# Patient Record
Sex: Male | Born: 1955 | Race: White | Hispanic: No | Marital: Single | Smoking: Never smoker
Health system: Southern US, Community
[De-identification: ages and names within clinical notes are randomized; demographics above are authoritative.]

## PROBLEM LIST (undated history)

## (undated) DIAGNOSIS — Z87448 Personal history of other diseases of urinary system: Secondary | ICD-10-CM

## (undated) DIAGNOSIS — M109 Gout, unspecified: Secondary | ICD-10-CM

## (undated) DIAGNOSIS — Z789 Other specified health status: Secondary | ICD-10-CM

## (undated) DIAGNOSIS — Z973 Presence of spectacles and contact lenses: Secondary | ICD-10-CM

## (undated) DIAGNOSIS — M1990 Unspecified osteoarthritis, unspecified site: Secondary | ICD-10-CM

## (undated) DIAGNOSIS — Z972 Presence of dental prosthetic device (complete) (partial): Secondary | ICD-10-CM

## (undated) HISTORY — PX: COLONOSCOPY: SHX174

## (undated) HISTORY — PX: JOINT REPLACEMENT: SHX530

## (undated) HISTORY — PX: ARTHROPLASTY: SHX135

## (undated) HISTORY — PX: TONSILLECTOMY: SUR1361

---

## 1998-05-17 ENCOUNTER — Encounter: Payer: Self-pay | Admitting: Emergency Medicine

## 1998-05-17 ENCOUNTER — Emergency Department (HOSPITAL_COMMUNITY): Admission: EM | Admit: 1998-05-17 | Discharge: 1998-05-17 | Payer: Self-pay | Admitting: Emergency Medicine

## 1999-09-21 ENCOUNTER — Encounter: Payer: Self-pay | Admitting: Emergency Medicine

## 1999-09-21 ENCOUNTER — Emergency Department (HOSPITAL_COMMUNITY): Admission: EM | Admit: 1999-09-21 | Discharge: 1999-09-21 | Payer: Self-pay | Admitting: Emergency Medicine

## 2000-08-19 ENCOUNTER — Emergency Department (HOSPITAL_COMMUNITY): Admission: EM | Admit: 2000-08-19 | Discharge: 2000-08-20 | Payer: Self-pay | Admitting: Emergency Medicine

## 2012-05-08 ENCOUNTER — Inpatient Hospital Stay (HOSPITAL_COMMUNITY)
Admission: EM | Admit: 2012-05-08 | Discharge: 2012-05-11 | DRG: 278 | Disposition: A | Discharge status: Home / Self Care | Payer: BC Managed Care – PPO | Attending: Internal Medicine | Admitting: Internal Medicine

## 2012-05-08 ENCOUNTER — Encounter (HOSPITAL_COMMUNITY): Payer: Self-pay | Admitting: Emergency Medicine

## 2012-05-08 DIAGNOSIS — L02419 Cutaneous abscess of limb, unspecified: Principal | ICD-10-CM | POA: Diagnosis present

## 2012-05-08 DIAGNOSIS — N289 Disorder of kidney and ureter, unspecified: Secondary | ICD-10-CM | POA: Diagnosis present

## 2012-05-08 DIAGNOSIS — E860 Dehydration: Secondary | ICD-10-CM | POA: Diagnosis present

## 2012-05-08 DIAGNOSIS — Z792 Long term (current) use of antibiotics: Secondary | ICD-10-CM

## 2012-05-08 DIAGNOSIS — L03119 Cellulitis of unspecified part of limb: Principal | ICD-10-CM | POA: Diagnosis present

## 2012-05-08 DIAGNOSIS — L0291 Cutaneous abscess, unspecified: Secondary | ICD-10-CM

## 2012-05-08 DIAGNOSIS — L02416 Cutaneous abscess of left lower limb: Secondary | ICD-10-CM | POA: Diagnosis present

## 2012-05-08 DIAGNOSIS — L0390 Cellulitis, unspecified: Secondary | ICD-10-CM

## 2012-05-08 DIAGNOSIS — A4902 Methicillin resistant Staphylococcus aureus infection, unspecified site: Secondary | ICD-10-CM | POA: Diagnosis present

## 2012-05-08 DIAGNOSIS — M109 Gout, unspecified: Secondary | ICD-10-CM | POA: Diagnosis present

## 2012-05-08 HISTORY — DX: Other specified health status: Z78.9

## 2012-05-08 LAB — CBC WITH DIFFERENTIAL/PLATELET
Basophils Absolute: 0 10*3/uL (ref 0.0–0.1)
Basophils Relative: 0 % (ref 0–1)
Eosinophils Absolute: 0 10*3/uL (ref 0.0–0.7)
Eosinophils Relative: 0 % (ref 0–5)
HCT: 41.6 % (ref 39.0–52.0)
Hemoglobin: 14.1 g/dL (ref 13.0–17.0)
Lymphocytes Relative: 12 % (ref 12–46)
Lymphs Abs: 1.5 10*3/uL (ref 0.7–4.0)
MCH: 30.1 pg (ref 26.0–34.0)
MCHC: 33.9 g/dL (ref 30.0–36.0)
MCV: 88.9 fL (ref 78.0–100.0)
Monocytes Absolute: 1.1 10*3/uL — ABNORMAL HIGH (ref 0.1–1.0)
Monocytes Relative: 8 % (ref 3–12)
Neutro Abs: 10.3 10*3/uL — ABNORMAL HIGH (ref 1.7–7.7)
Neutrophils Relative %: 80 % — ABNORMAL HIGH (ref 43–77)
Platelets: 236 10*3/uL (ref 150–400)
RBC: 4.68 MIL/uL (ref 4.22–5.81)
RDW: 12.8 % (ref 11.5–15.5)
WBC: 13 10*3/uL — ABNORMAL HIGH (ref 4.0–10.5)

## 2012-05-08 LAB — BASIC METABOLIC PANEL
BUN: 19 mg/dL (ref 6–23)
CO2: 23 mEq/L (ref 19–32)
Calcium: 9.5 mg/dL (ref 8.4–10.5)
Chloride: 101 mEq/L (ref 96–112)
Creatinine, Ser: 1.43 mg/dL — ABNORMAL HIGH (ref 0.50–1.35)
GFR calc Af Amer: 61 mL/min — ABNORMAL LOW (ref >90)
GFR calc non Af Amer: 53 mL/min — ABNORMAL LOW (ref >90)
Glucose, Bld: 127 mg/dL — ABNORMAL HIGH (ref 70–99)
Potassium: 3.8 mEq/L (ref 3.5–5.1)
Sodium: 136 mEq/L (ref 135–145)

## 2012-05-08 MED ORDER — TRAZODONE HCL 50 MG PO TABS
50.0000 mg | ORAL_TABLET | Freq: Every evening | ORAL | Status: DC | PRN
  Filled 2012-05-08: qty 1

## 2012-05-08 MED ORDER — ENOXAPARIN SODIUM 60 MG/0.6ML ~~LOC~~ SOLN
60.0000 mg | Freq: Every day | SUBCUTANEOUS | Status: DC
  Administered 2012-05-08 – 2012-05-10 (×3): 60 mg via SUBCUTANEOUS
  Filled 2012-05-08 (×4): qty 0.6

## 2012-05-08 MED ORDER — SODIUM CHLORIDE 0.9 % IV SOLN
INTRAVENOUS | Status: DC
  Administered 2012-05-08: 23:00:00 via INTRAVENOUS

## 2012-05-08 MED ORDER — SENNOSIDES-DOCUSATE SODIUM 8.6-50 MG PO TABS
1.0000 | ORAL_TABLET | Freq: Every evening | ORAL | Status: DC | PRN
  Filled 2012-05-08: qty 1

## 2012-05-08 MED ORDER — ACETAMINOPHEN 650 MG RE SUPP: 650.0000 mg | Freq: Four times a day (QID) | RECTAL | Status: DC | PRN

## 2012-05-08 MED ORDER — SODIUM CHLORIDE 0.9 % IV BOLUS (SEPSIS)
1000.0000 mL | Freq: Once | INTRAVENOUS | Status: AC
  Administered 2012-05-08: 1000 mL via INTRAVENOUS

## 2012-05-08 MED ORDER — ACETAMINOPHEN 325 MG PO TABS: 650.0000 mg | ORAL_TABLET | Freq: Four times a day (QID) | ORAL | Status: DC | PRN

## 2012-05-08 MED ORDER — ONDANSETRON HCL 4 MG/2ML IJ SOLN: 4.0000 mg | Freq: Four times a day (QID) | INTRAMUSCULAR | Status: DC | PRN

## 2012-05-08 MED ORDER — VANCOMYCIN HCL 10 G IV SOLR
2500.0000 mg | Freq: Once | INTRAVENOUS | Status: AC
  Administered 2012-05-08: 2500 mg via INTRAVENOUS
  Filled 2012-05-08: qty 2500

## 2012-05-08 MED ORDER — CLINDAMYCIN PHOSPHATE 900 MG/50ML IV SOLN
900.0000 mg | Freq: Once | INTRAVENOUS | Status: AC
  Administered 2012-05-08: 900 mg via INTRAVENOUS
  Filled 2012-05-08: qty 50

## 2012-05-08 MED ORDER — ONDANSETRON HCL 4 MG PO TABS: 4.0000 mg | ORAL_TABLET | Freq: Four times a day (QID) | ORAL | Status: DC | PRN

## 2012-05-08 MED ORDER — OXYCODONE HCL 5 MG PO TABS
5.0000 mg | ORAL_TABLET | ORAL | Status: DC | PRN
  Administered 2012-05-09: 5 mg via ORAL
  Filled 2012-05-08: qty 1

## 2012-05-08 MED ORDER — SODIUM CHLORIDE 0.9 % IV SOLN
1750.0000 mg | INTRAVENOUS | Status: DC
  Administered 2012-05-09 – 2012-05-10 (×2): 1750 mg via INTRAVENOUS
  Filled 2012-05-08 (×3): qty 1750

## 2012-05-08 NOTE — ED Provider Notes (Signed)
History     CSN: [id number]  Arrival date & time 05/08/12  1826   First MD Initiated Contact with Patient 05/08/12 1910      Chief Complaint  Patient presents with  . Abscess    left knee  . Knee Pain    (Consider location/radiation/quality/duration/timing/severity/associated sxs/prior treatment) Patient is a 57 y.o. male presenting with abscess and knee pain. The history is provided by the patient.  Abscess  This is a new problem. The current episode started less than one week ago. The onset was gradual. Pertinent negatives include no fever. Associated symptoms comments: Draining, painful lesion to anterior left knee for several days, now with increasing pain and redness in surrounding area. He was seen by his PCP 2 days ago and started on Cipro but felt the symptoms were worse and needed re-evaluation. No fever. .  Knee Pain Pertinent negatives include no abdominal pain, chest pain, fever, headaches, myalgias or nausea.    History reviewed. No pertinent past medical history.  Past Surgical History  Procedure Date  . Arthroplasty     left knee 25 years ago    No family history on file.  History  Substance Use Topics  . Smoking status: Never Smoker   . Smokeless tobacco: Never Used  . Alcohol Use: No      Review of Systems  Constitutional: Negative for fever.  Respiratory: Negative for shortness of breath.   Cardiovascular: Negative for chest pain.  Gastrointestinal: Negative for nausea and abdominal pain.  Genitourinary: Negative for dysuria.  Musculoskeletal: Negative for myalgias.  Skin: Positive for wound.  Neurological: Negative for headaches.  Psychiatric/Behavioral: Negative for confusion.    Allergies  Review of patient's allergies indicates no known allergies.  Home Medications   Current Outpatient Rx  Name  Route  Sig  Dispense  Refill  . CIPROFLOXACIN HCL 500 MG PO TABS   Oral   Take 500 mg by mouth 2 (two) times daily. 10 day supply started  05/06/12           BP 130/73  Pulse 116  Temp 98.3 F (36.8 C) (Oral)  Resp 20  SpO2 97%  Physical Exam  Constitutional: He is oriented to person, place, and time. He appears well-developed and well-nourished. No distress.  Pulmonary/Chest: Effort normal.  Abdominal: Soft. There is no tenderness.  Musculoskeletal: He exhibits no edema.       Left knee has large, draining abscess to anterior surface in prepatellar area with erythema covering entire anterior surface. No effusion. Joint with full range of motion.   Neurological: He is alert and oriented to person, place, and time.  Skin: Skin is warm and dry. There is erythema.  Psychiatric: He has a normal mood and affect.    ED Course  Procedures (including critical care time)   Labs Reviewed  CBC WITH DIFFERENTIAL  BASIC METABOLIC PANEL  CULTURE, ROUTINE-ABSCESS   No results found.   No diagnosis found.    MDM  INCISION AND DRAINAGE Performed by: Elpidio Anis A Consent: Verbal consent obtained. Risks and benefits: risks, benefits and alternatives were discussed Type: abscess  Body area: left knee  Anesthesia: local infiltration  Incision was made with a 11-blade scalpel.  Local anesthetic: lidocaine 1% w/ epinephrine  Anesthetic total: 2 ml  Complexity: complex Blunt dissection to break up loculations  Drainage: purulent  Drainage amount: large  Packing material: 1/4 in iodoform gauze  Patient tolerance: Patient tolerated the procedure well with no immediate complications.  Plan: patient care transferred to Ivonne Andrew, PA-C with labs pending. Patient seen by Dr. Freida Busman and felt admission was appropriate given extent of infection. Patient declines need for pain medication for now.         Arnoldo Hooker, PA-C 05/08/12 1953

## 2012-05-08 NOTE — ED Provider Notes (Signed)
[name] S 8:00 PM patient discussed in sign out with Langley Adie PA-C.  Patient with extensive abscess and cellulitis around left knee. Infection is concerning and plan to have admission for continued IV antibiotics. Lab tests pending.   8:40PM Spoke with Dr. Lendell Caprice with [facility] she will see patient and admit.      Angus Seller, [place] 05/08/12 2043

## 2012-05-08 NOTE — ED Provider Notes (Signed)
Medical screening examination/treatment/procedure(s) were performed by non-physician practitioner and as supervising physician I was immediately available for consultation/collaboration.  Doug Sou, MD 05/08/12 [phone]

## 2012-05-08 NOTE — Progress Notes (Signed)
ANTIBIOTIC CONSULT NOTE - INITIAL  Pharmacy Consult for Vancomycin Indication: Cellulitis with Abscess of Left Knee  No Known Allergies  Patient Measurements: Height: 6' (182.9 cm) Weight: 275 lb (124.739 kg) IBW/kg (Calculated) : 77.6    Vital Signs: Temp: 98.6 F (37 C) (01/05 2240) Temp src: Oral (01/05 2240) BP: 122/79 mmHg (01/05 2240) Pulse Rate: 93  (01/05 2240) Intake/Output from previous day:   Intake/Output from this shift:    Labs:  Basename 05/08/12 1945  WBC 13.0*  HGB 14.1  PLT 236  LABCREA --  CREATININE 1.43*   Estimated Creatinine Clearance: 77.7 ml/min (by C-G formula based on Cr of 1.43). No results found for this basename: VANCOTROUGH:2,VANCOPEAK:2,VANCORANDOM:2,GENTTROUGH:2,GENTPEAK:2,GENTRANDOM:2,TOBRATROUGH:2,TOBRAPEAK:2,TOBRARND:2,AMIKACINPEAK:2,AMIKACINTROU:2,AMIKACIN:2, in the last 72 hours   Microbiology: No results found for this or any previous visit (from the past 720 hour(s)).  Medical History: History reviewed. No pertinent past medical history.  Medications:  Scheduled:    . [COMPLETED] clindamycin (CLEOCIN) IV  900 mg Intravenous Once  . enoxaparin (LOVENOX) injection  60 mg Subcutaneous QHS  . [COMPLETED] sodium chloride  1,000 mL Intravenous Once   Infusions:    . sodium chloride     Assessment:  57 yr old male treated with Cipro as outpatient for knee infection with several boils in the knee area.  Erythema and swelling extending up to the groin and down to the ankle noted.  In ED, one boil incised and sent for culture  Received Clindamycin 900mg  IV x 1 in ED @ 20:20  Admitted for IV Antibiotics.  IV Vancomycin per pharmacy ordered cellulitis and abscess of left knee  CrCl (n) = 58 ml/min  Goal of Therapy:  Vancomycin trough level 10-15 mcg/ml  Plan:  Measure antibiotic drug levels at steady state Follow up culture results Vancomycin 2500mg  IV x 1 load followed by 1750 mg IV q24h  Travia Onstad, Joselyn Glassman,  PharmD 05/08/2012,10:53 PM

## 2012-05-08 NOTE — H&P (Signed)
Hospital Admission Note Date: 05/08/2012  Patient name: Devin Mcdonald Medical record number: [id number] Date of birth: [birth year] Age: 57 y.o. Gender: male PCP: None, but goes to [place] walk in clinic as needed  Chief Complaint:   History of Present Illness:  Devin Mcdonald is an 57 y.o. male with no chronic medical problems who presents with a knee infection. He has no primary care provider, but frequently goes to the [facility] walk in clinic as needed. Last week, he noticed several boils in the knee area. They were send, he had erythema and swelling extending up to the groin and down to the ankle. He was seen at the walk-in clinic and given a prescription for Cipro. He was told to come to the emergency room should the infection worsened over the weekend. One of the boil started draining so he came to the emergency room. The largest area of fluctuance, just over the patella was incised by the ED staff, and drainage sent for culture. Patient has cellulitis in the knee area and therefore the hospitalists were called to admit for IV antibiotics and observation. Patient had fevers and chills a few nights ago but none since. He has difficulty flexing his knee due to swelling. He is able to bear weight.  History reviewed. No pertinent past medical history.  Meds: Cipro  Allergies: Review of patient's allergies indicates no known allergies.  Social history: Patient is single. He is a [employer]. He does not smoke drink or use drugs  Family history: Mother is deceased and had problems with substance abuse  Past Surgical History  Procedure Date  . Arthroplasty     left knee 25 years ago   Review of Systems: Systems reviewed and as per HPI, otherwise negative.  Physical Exam: Blood pressure 130/73, pulse 116, temperature 98.3 F (36.8 C), temperature source Oral, resp. rate 20, SpO2 97.00%. BP 130/73  Pulse 116  Temp 98.3 F (36.8 C) (Oral)  Resp 20  SpO2 97%  General Appearance:     Alert, cooperative, no distress, appears stated age.nontoxic   Head:    Normocephalic, without obvious abnormality, atraumatic  Eyes:    PERRL, conjunctiva/corneas clear, EOM's intact, fundi    benign, both eyes  Ears:    Normal TM's and external ear canals, both ears  Nose:   Nares normal, septum midline, mucosa normal, no drainage    or sinus tenderness  Throat:   Lips, mucosa, and tongue normal; teeth and gums normal  Neck:   Supple, symmetrical, trachea midline, no adenopathy;    thyroid:  no enlargement/tenderness/nodules; no carotid   bruit or JVD  Back:     Symmetric, no curvature, ROM normal, no CVA tenderness  Lungs:     Clear to auscultation bilaterally, respirations unlabored  Chest Wall:    No tenderness or deformity   Heart:    Regular rate and rhythm, S1 and S2 normal, no murmur, rub   or gallop     Abdomen:     Soft, non-tender, bowel sounds active all four quadrants,    no masses, no organomegaly  Genitalia:   deferred   Rectal:   deferred  Extremities:  left knee with intense erythema tenderness and warmth with purulent drainage and packing present near the patella. Decreased range of motion due to pain. Mild patchy erythema more proximally.several small purulent papules surrounding the area. Also a erythematous papule present on the left hand without drainage. No significant cellulitis of the hand  Pulses:   2+ and symmetric all extremities  Skin:   see above   Lymph nodes:   Cervical, supraclavicular, and axillary nodes normal  Neurologic:   CNII-XII intact, normal strength, sensation and reflexes    throughout    Psychiatric: Normal affect.  Lab results: Basic Metabolic Panel:  Basename 05/08/12 1945  NA 136  K 3.8  CL 101  CO2 23  GLUCOSE 127*  BUN 19  CREATININE 1.43*  CALCIUM 9.5  MG --  PHOS --   Liver Function Tests: No results found for this basename: AST:2,ALT:2,ALKPHOS:2,BILITOT:2,PROT:2,ALBUMIN:2 in the last 72 hours No results found for this  basename: LIPASE:2,AMYLASE:2 in the last 72 hours No results found for this basename: AMMONIA:2 in the last 72 hours CBC:  Basename 05/08/12 1945  WBC 13.0*  NEUTROABS 10.3*  HGB 14.1  HCT 41.6  MCV 88.9  PLT 236   Assessment & Plan: Principal Problem:  *Abscess of left knee, post I&D. Cultures are pending. Active Problems:  Renal insufficiency  Will place on observation. Give IV vancomycin. Hydrate. Repeat basic metabolic panel in the morning. DVT prophylaxis. Pain medications as needed.  Eola Waldrep L 05/08/2012, 9:15 PM

## 2012-05-08 NOTE — ED Provider Notes (Signed)
Medical screening examination/treatment/procedure(s) were performed by non-physician practitioner and as supervising physician I was immediately available for consultation/collaboration.  Adiva Boettner, MD 05/08/12 2323 

## 2012-05-08 NOTE — ED Notes (Addendum)
Pt presents w/ red, painful, swollen left knee. Pt noted 2 small pimples on knee 12/26. Pt seen at [facility] last evening and started on oral antibx and instructed to come to ED if pain worsens. Knee is now draining large amts of reddish pink liquid, although pt does indicate the swelling is less today. Pt has pimple on back of left hand

## 2012-05-09 ENCOUNTER — Encounter (HOSPITAL_COMMUNITY): Payer: Self-pay | Admitting: *Deleted

## 2012-05-09 DIAGNOSIS — L0291 Cutaneous abscess, unspecified: Secondary | ICD-10-CM

## 2012-05-09 DIAGNOSIS — M109 Gout, unspecified: Secondary | ICD-10-CM | POA: Diagnosis present

## 2012-05-09 DIAGNOSIS — E860 Dehydration: Secondary | ICD-10-CM

## 2012-05-09 LAB — HIV ANTIBODY (ROUTINE TESTING W REFLEX): HIV: NONREACTIVE

## 2012-05-09 LAB — BASIC METABOLIC PANEL
Chloride: 104 mEq/L (ref 96–112)
Creatinine, Ser: 1.26 mg/dL (ref 0.50–1.35)
GFR calc Af Amer: 72 mL/min — ABNORMAL LOW (ref >90)
Potassium: 3.9 mEq/L (ref 3.5–5.1)

## 2012-05-09 LAB — CBC
HCT: 37.3 % — ABNORMAL LOW (ref 39.0–52.0)
Hemoglobin: 12.8 g/dL — ABNORMAL LOW (ref 13.0–17.0)
MCV: 88.2 fL (ref 78.0–100.0)
RBC: 4.23 MIL/uL (ref 4.22–5.81)
WBC: 10 10*3/uL (ref 4.0–10.5)

## 2012-05-09 MED ORDER — PIPERACILLIN-TAZOBACTAM 3.375 G IVPB
3.3750 g | Freq: Three times a day (TID) | INTRAVENOUS | Status: DC
  Administered 2012-05-09 – 2012-05-10 (×4): 3.375 g via INTRAVENOUS
  Filled 2012-05-09 (×4): qty 50

## 2012-05-09 MED ORDER — PREDNISONE 20 MG PO TABS
40.0000 mg | ORAL_TABLET | Freq: Every day | ORAL | Status: DC
  Administered 2012-05-09 – 2012-05-11 (×3): 40 mg via ORAL
  Filled 2012-05-09 (×4): qty 2

## 2012-05-09 MED ORDER — IBUPROFEN 600 MG PO TABS
600.0000 mg | ORAL_TABLET | Freq: Three times a day (TID) | ORAL | Status: DC
  Administered 2012-05-09 – 2012-05-11 (×6): 600 mg via ORAL
  Filled 2012-05-09 (×9): qty 1

## 2012-05-09 MED ORDER — PANTOPRAZOLE SODIUM 40 MG PO TBEC
40.0000 mg | DELAYED_RELEASE_TABLET | Freq: Every day | ORAL | Status: DC
  Administered 2012-05-09 – 2012-05-11 (×3): 40 mg via ORAL
  Filled 2012-05-09 (×4): qty 1

## 2012-05-09 NOTE — Progress Notes (Signed)
ANTIBIOTIC CONSULT NOTE - INITIAL  Pharmacy Consult for Zosyn Indication: LLE cellulitis/abcess  No Known Allergies  Patient Measurements: Height: 6' (182.9 cm) Weight: 275 lb (124.739 kg) IBW/kg (Calculated) : 77.6   Vital Signs: Temp: 97.9 F (36.6 C) (01/06 0626) Temp src: Oral (01/06 0626) BP: 106/69 mmHg (01/06 0626) Pulse Rate: 85  (01/06 0626) Intake/Output from previous day: 01/05 0701 - 01/06 0700 In: 875 [I.V.:375; IV Piggyback:500] Out: 600 [Urine:600] Intake/Output from this shift: Total I/O In: -  Out: 450 [Urine:450]  Labs:  [facility] 05/09/12 0430 05/08/12 1945  WBC -- 13.0*  HGB -- 14.1  PLT -- 236  LABCREA -- --  CREATININE 1.26 1.43*   Estimated Creatinine Clearance: 88.2 ml/min (by C-G formula based on Cr of 1.26). No results found for this basename: VANCOTROUGH:2,VANCOPEAK:2,VANCORANDOM:2,GENTTROUGH:2,GENTPEAK:2,GENTRANDOM:2,TOBRATROUGH:2,TOBRAPEAK:2,TOBRARND:2,AMIKACINPEAK:2,AMIKACINTROU:2,AMIKACIN:2, in the last 72 hours    Assessment: 57 yo obese male presented 1/5 with c/o red,painful, swollen L knee. Pt seen at [facility] primary care 1/3 and prescribed Cipro with order to come to ED if pain worsens. Abscess now present. I&D performed in the ED.  Vancomycin started 1/5, MD now adding Zosyn for gram negative coverage.  Patient is afebrile, WBC is 13K from 1/5, Scr improving with CrCl of 88 ml/min, normalized 65 ml/min.  Abscess culture collected and currently pending.    Goal of Therapy:  Vancomycin trough level 15-20 mcg/ml Appropriate renal dosing of Zosyn  Plan:   Zosyn 3.375 gm IV q8h  Continue Vancomycin 1750 mg IV q24h  Pharmacy will f/u  Geoffry Paradise, PharmD, BCPS Pager: [phone] 9:43 AM Pharmacy #: [phone]

## 2012-05-09 NOTE — Care Management Note (Addendum)
    Page 1 of 1   05/11/2012     12:19:34 PM   CARE MANAGEMENT NOTE 05/11/2012  Patient:  Devin Mcdonald, Devin Mcdonald   Account Number:  [id number]  Date Initiated:  05/09/2012  Documentation initiated by:  Lorenda Ishihara  Subjective/Objective Assessment:   57 yo male admitted with knee abscess. PTA lived at home alone.     Action/Plan:   Home when stable   Anticipated DC Date:  05/11/2012   Anticipated DC Plan:  HOME/SELF CARE      DC Planning Services  CM consult  Other      Choice offered to / List presented to:             Status of service:  Completed, signed off Medicare Important Message given?   (If response is "NO", the following Medicare IM given date fields will be blank) Date Medicare IM given:   Date Additional Medicare IM given:    Discharge Disposition:  HOME/SELF CARE  Per UR Regulation:  Reviewed for med. necessity/level of care/duration of stay  If discussed at Long Length of Stay Meetings, dates discussed:    Comments:  05-11-12 Lorenda Ishihara RN CM 1200 Spoke with patient at bedside. Will need f/u with wound care. Have arrange for an appt with [facility] wound care ctr for 8 am 05-12-12. Benefits check for Zyvox will require patient to pay $75 out of pocket. Patient to discuss with physician to assess if there is an alternative. Patient has contacted [name] and has PCP Devin Mcdonald). Plans to f/u with him at d/c.

## 2012-05-09 NOTE — Progress Notes (Signed)
[facility] PROGRESS NOTE  Devin Mcdonald ZOX:[id number] DOB: [birth year] DOA: 05/08/2012 PCP: No primary provider on file.  Assessment/Plan: Principal Problem:  *Abscess of left knee Active Problems:  Renal insufficiency    1. Cellulitis LLE: Patient presented with progressive redness, swelling and pain of LLE, complicating a couple of boils over the left knee. Managing with iv Vancomycin, now day# 2. Abscess and blood cultures are pending. He has remained afebrile overnight, and local inflammatory phenomena have already improved. Will add Zosyn.  2. Abscess of left knee: This was the culprit for #1 above. Patient is s/p I&D done by ED MD. Abscess cultures are pending. Consult [facility] for wound care. Do HIV test.  3. Dehydration/AKI. Patient had creatinine of 1.43 at presentation, BUN 19. This is likely secondary to mild dehydration. Managed with iv fluids, and creatinine is 1.26 today. Have discontinue iv fluids today.  4. Gout: Patient has known history of gout, with periodic flares. Now complaining of pain in the forefoot, reminiscent of similar previous flares. Will manage with NSAIDS and short course of steroids.    Code Status: Full Code.  Family Communication:  Disposition Plan: To be determined.    Brief narrative: 57 y.o. 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 He has no primary care provider, but goes to the [place] walk-in clinic as needed. Last week, he noticed several boils in the left knee area, followed by erythema and swelling extending up to the groin and down to the ankle. He was seen at the walk-in clinic and given a prescription for Cipro. He was told to come to the emergency room should the infection worsened over the weekend. One of the boils started draining, so he came to the emergency room. The largest area of fluctuance, just over the patella was incised by the ED  staff, and drainage sent for culture. Patient had fevers and chills a few nights ago but none since. He has difficulty flexing his knee due to swelling. He is able to bear weight. He was admitted for further management.    Consultants:  N/A  Procedures:  N/A  Antibiotics:  Vancomycin 05/08/12>>>  Clindamycin 05/08/12>>>  HPI/Subjective: Feels better. Complains of pain left forefoot.   Objective: Vital signs in last 24 hours: Temp:  [97.9 F (36.6 C)-98.6 F (37 C)] 97.9 F (36.6 C) (01/06 0626) Pulse Rate:  [85-116] 85  (01/06 0626) Resp:  [18-20] 18  (01/06 0626) BP: (106-130)/(69-79) 106/69 mmHg (01/06 0626) SpO2:  [97 %-99 %] 97 % (01/06 0626) Weight:  [124.739 kg (275 lb)] 124.739 kg (275 lb) (01/05 2235) Weight change:  Last BM Date: 05/07/12  Intake/Output from previous day: 01/05 0701 - 01/06 0700 In: 875 [I.V.:375; IV Piggyback:500] Out: 600 [Urine:600]     Physical Exam: General: Comfortable, alert, communicative, fully oriented, not short of breath at rest.  HEENT:  No clinical pallor, no jaundice, no conjunctival injection or discharge. Hydration is fair.  NECK:  Supple, JVP not seen, no carotid bruits, no palpable lymphadenopathy, no palpable goiter. CHEST:  Clinically clear to auscultation, no wheezes, no crackles. HEART:  Sounds 1 and 2 heard, normal, regular, no murmurs. ABDOMEN:  Full, soft, non-tender, no palpable organomegaly, no palpable masses, normal bowel sounds. GENITALIA:  Not examined. LOWER EXTREMITIES:  RLE is unremarkable. LLE: Knee is under dressings. Erythema and  induration is noted on left thigh. Borders demarcated with marker pen. No  pitting edema, palpable peripheral pulses. MUSCULOSKELETAL SYSTEM:  Unremarkable. CENTRAL NERVOUS SYSTEM:  No focal neurologic deficit on gross examination.  Lab Results:  [facility] 05/08/12 1945  WBC 13.0*  HGB 14.1  HCT 41.6  PLT 236    Basename 05/09/12 0430 05/08/12 1945  NA 137 136  K 3.9  3.8  CL 104 101  CO2 22 23  GLUCOSE 99 127*  BUN 17 19  CREATININE 1.26 1.43*  CALCIUM 8.5 9.5   Recent Results (from the past 240 hour(s))  CULTURE, ROUTINE-ABSCESS     Status: Normal (Preliminary result)   Collection Time   05/08/12  7:51 PM      Component Value Range Status Comment   Specimen Description KNEE JOINT   Final    Special Requests NONE   Final    Gram Stain PENDING   Incomplete    Culture NO GROWTH   Final    Report Status PENDING   Incomplete      Studies/Results: No results found.  Medications: Scheduled Meds:   . enoxaparin (LOVENOX) injection  60 mg Subcutaneous QHS  . vancomycin  1,750 mg Intravenous Q24H   Continuous Infusions:   . sodium chloride 50 mL/hr at 05/08/12 2230   PRN Meds:.acetaminophen, acetaminophen, ondansetron (ZOFRAN) IV, ondansetron, oxyCODONE, senna-docusate, traZODone    LOS: 1 day   Devin Mcdonald,CHRISTOPHER  [facility] Pager [phone]. If 8PM-8AM, please contact night-coverage at [email or web address], password [facility] 05/09/2012, 8:21 AM  LOS: 1 day

## 2012-05-10 LAB — BASIC METABOLIC PANEL
BUN: 19 mg/dL (ref 6–23)
CO2: 22 mEq/L (ref 19–32)
Calcium: 8.9 mg/dL (ref 8.4–10.5)
GFR calc non Af Amer: 60 mL/min — ABNORMAL LOW (ref >90)
Glucose, Bld: 116 mg/dL — ABNORMAL HIGH (ref 70–99)

## 2012-05-10 LAB — CBC
HCT: 34.9 % — ABNORMAL LOW (ref 39.0–52.0)
Hemoglobin: 12.1 g/dL — ABNORMAL LOW (ref 13.0–17.0)
MCH: 30.3 pg (ref 26.0–34.0)
MCHC: 34.7 g/dL (ref 30.0–36.0)
MCV: 87.3 fL (ref 78.0–100.0)
RBC: 4 MIL/uL — ABNORMAL LOW (ref 4.22–5.81)

## 2012-05-10 NOTE — Consult Note (Signed)
WOC consult Note Reason for Consult:  S/P I&D of left knee lesion. Consult requested by Dr. Brien Few. Wound type:surgical vs infectious Pressure Ulcer POA: No Measurement:Blister surrounding wound measures 3cm x 6cm x .2cm.  Area in center is a purple discoloration measuring 2cm x 2cm with a .5cm round defect that has a depth of 1cm and undermining to 1.5cm circumferentially. Wound bed:Not able to visulaize inside of drained abscess Drainage (amount, consistency, odor) Thick purulent exudate expressed from wound, approximately 5ml Periwound:resolving erythema and induration Dressing procedure/placement/frequency: I recommend inserting a "wick" of silver hydrofiber (Aquacel Ag+) into wound and fan-folding the excess across the wound's exterior surfaces before topping with DSD.  The patient will require follow up and has no PCP at this time.  Suggest referral to either outpatient wound care center at [facility] or orthopedics, but will need follow-up by the end of this week.  If not able to be seen by that time, patient may require home health RN for dressing changes. I will not follow.  Please re-consult if needed. Thanks, Ladona Mow, MSN, RN, [facility], CWOCN [phone])

## 2012-05-10 NOTE — Progress Notes (Signed)
[facility] PROGRESS NOTE  Devin Mcdonald AVW:[id number] DOB: Feb 11, 1956 DOA: 05/08/2012 PCP: No primary provider on file.  Assessment/Plan: Principal Problem:  *Abscess of left knee Active Problems:  Renal insufficiency  Dehydration  Gout attack    1. Cellulitis LLE: Patient presented with progressive redness, swelling and pain of LLE, complicating a couple of boils over the left knee. Managing with iv Vancomycin day# 3/Zosyn day#2. Blood cultures are pending. Abscess culture grew Staph Aureus. Suspect MRSA. He has remained afebrile overnight, and local inflammatory phenomena have dramatically improved. Will discontinue Zosyn.  2. Abscess of left knee: This was the culprit for #1 above. Patient is s/p I&D done by ED MD. Abscess cultures are pending. Consulted WOC for wound care. HIV test is non-reactive.  3. Dehydration/AKI. Patient had creatinine of 1.43 at presentation, BUN 19. This is likely secondary to mild dehydration. Managed with iv fluids, and creatinine was 1.26 on 05/09/12. IV fluids were discontinued accordingly.  4. Gout: Patient has known history of gout, with periodic flares. On 05/09/12, complained of pain in the forefoot, reminiscent of similar previous flares. Manage with NSAIDS and 5-day course of steroids. Improved.    Code Status: Full Code.  Family Communication:  Disposition Plan: To be determined. Aiming discharge on 05/11/12.    Brief narrative: 57 y.o. 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 He has no primary care provider, but goes to the [place] walk-in clinic as needed. Last week, he noticed several boils in the left knee area, followed by erythema and swelling extending up to the groin and down to the ankle. He was seen at the walk-in clinic and given a prescription for Cipro. He was told to come to the emergency room should the infection worsened over the  weekend. One of the boils started draining, so he came to the emergency room. The largest area of fluctuance, just over the patella was incised by the ED staff, and drainage sent for culture. Patient had fevers and chills a few nights ago but none since. He has difficulty flexing his knee due to swelling. He is able to bear weight. He was admitted for further management.    Consultants:  N/A  Procedures:  N/A  Antibiotics:  Vancomycin 05/08/12>>>  Clindamycin 05/08/12>>>  HPI/Subjective: Feels better.   Objective: Vital signs in last 24 hours: Temp:  [97.8 F (36.6 C)-98.1 F (36.7 C)] 98.1 F (36.7 C) (01/06 2200) Pulse Rate:  [79-89] 79  (01/06 2200) Resp:  [18] 18  (01/06 2200) BP: (114-123)/(68-74) 123/74 mmHg (01/06 2200) SpO2:  [94 %-97 %] 97 % (01/06 2200) Weight change:  Last BM Date: 05/07/12  Intake/Output from previous day: 01/06 0701 - 01/07 0700 In: 650 [IV Piggyback:650] Out: 2325 [Urine:2325] Total I/O In: -  Out: 275 [Urine:275]   Physical Exam: General: Comfortable, alert, communicative, fully oriented, not short of breath at rest.  HEENT:  No clinical pallor, no jaundice, no conjunctival injection or discharge. Hydration is fair.  NECK:  Supple, JVP not seen, no carotid bruits, no palpable lymphadenopathy, no palpable goiter. CHEST:  Clinically clear to auscultation, no wheezes, no crackles. HEART:  Sounds 1 and 2 heard, normal, regular, no murmurs. ABDOMEN:  Full, soft, non-tender, no palpable organomegaly, no palpable masses, normal bowel sounds. GENITALIA:  Not examined. LOWER EXTREMITIES:  RLE is unremarkable. LLE: Knee is under dressings. Left thigh erythema and  induration has dramatically improved. No  pitting edema, palpable peripheral pulses. MUSCULOSKELETAL SYSTEM:  Unremarkable. CENTRAL NERVOUS SYSTEM:  No focal neurologic deficit on gross examination.  Lab Results:  Basename 05/10/12 0428 05/09/12 1050  WBC 10.7* 10.0  HGB 12.1* 12.8*   HCT 34.9* 37.3*  PLT 258 248    Basename 05/10/12 0428 05/09/12 0430  NA 137 137  K 4.2 3.9  CL 107 104  CO2 22 22  GLUCOSE 116* 99  BUN 19 17  CREATININE 1.29 1.26  CALCIUM 8.9 8.5   Recent Results (from the past 240 hour(s))  CULTURE, ROUTINE-ABSCESS     Status: Normal (Preliminary result)   Collection Time   05/08/12  7:51 PM      Component Value Range Status Comment   Specimen Description KNEE JOINT   Final    Special Requests NONE   Final    Gram Stain     Final    Value: NO WBC SEEN     NO SQUAMOUS EPITHELIAL CELLS SEEN     FEW GRAM POSITIVE COCCI     IN PAIRS   Culture     Final    Value: MODERATE STAPHYLOCOCCUS AUREUS     Note: RIFAMPIN AND GENTAMICIN SHOULD NOT BE USED AS SINGLE DRUGS FOR TREATMENT OF STAPH INFECTIONS.   Report Status PENDING   Incomplete   CULTURE, BLOOD (ROUTINE X 2)     Status: Normal (Preliminary result)   Collection Time   05/09/12 10:50 AM      Component Value Range Status Comment   Specimen Description BLOOD LEFT ARM   Final    Special Requests BOTTLES DRAWN AEROBIC AND ANAEROBIC 5CC   Final    Culture  Setup Time 05/09/2012 14:15   Final    Culture     Final    Value:        BLOOD CULTURE RECEIVED NO GROWTH TO DATE CULTURE WILL BE HELD FOR 5 DAYS BEFORE ISSUING A FINAL NEGATIVE REPORT   Report Status PENDING   Incomplete   CULTURE, BLOOD (ROUTINE X 2)     Status: Normal (Preliminary result)   Collection Time   05/09/12 10:55 AM      Component Value Range Status Comment   Specimen Description BLOOD LEFT ARM   Final    Special Requests BOTTLES DRAWN AEROBIC AND ANAEROBIC 5CC   Final    Culture  Setup Time 05/09/2012 14:18   Final    Culture     Final    Value:        BLOOD CULTURE RECEIVED NO GROWTH TO DATE CULTURE WILL BE HELD FOR 5 DAYS BEFORE ISSUING A FINAL NEGATIVE REPORT   Report Status PENDING   Incomplete      Studies/Results: No results found.  Medications: Scheduled Meds:    . enoxaparin (LOVENOX) injection  60 mg  Subcutaneous QHS  . ibuprofen  600 mg Oral TID WC  . pantoprazole  40 mg Oral Q0600  . piperacillin-tazobactam (ZOSYN)  IV  3.375 g Intravenous Q8H  . predniSONE  40 mg Oral Q breakfast  . vancomycin  1,750 mg Intravenous Q24H   Continuous Infusions:  PRN Meds:.acetaminophen, acetaminophen, ondansetron (ZOFRAN) IV, ondansetron, oxyCODONE, senna-docusate, traZODone    LOS: 2 days   Devin Mcdonald,Devin Mcdonald  [facility] Pager [phone]. If 8PM-8AM, please contact night-coverage at [email or web address], password [facility] 05/10/2012, 10:59 AM  LOS: 2 days

## 2012-05-11 LAB — CULTURE, ROUTINE-ABSCESS: Gram Stain: NONE SEEN

## 2012-05-11 LAB — BASIC METABOLIC PANEL
CO2: 23 mEq/L (ref 19–32)
Calcium: 8.8 mg/dL (ref 8.4–10.5)
Creatinine, Ser: 1.16 mg/dL (ref 0.50–1.35)
GFR calc non Af Amer: 68 mL/min — ABNORMAL LOW (ref >90)
Glucose, Bld: 96 mg/dL (ref 70–99)
Sodium: 137 mEq/L (ref 135–145)

## 2012-05-11 LAB — CBC
MCH: 29.9 pg (ref 26.0–34.0)
MCHC: 33.9 g/dL (ref 30.0–36.0)
MCV: 88.2 fL (ref 78.0–100.0)
Platelets: 271 10*3/uL (ref 150–400)

## 2012-05-11 MED ORDER — LINEZOLID 600 MG PO TABS: 600.0000 mg | ORAL_TABLET | Freq: Two times a day (BID) | ORAL | Status: DC

## 2012-05-11 MED ORDER — PREDNISONE 20 MG PO TABS: 40.0000 mg | ORAL_TABLET | Freq: Every day | ORAL | Status: DC

## 2012-05-11 NOTE — Discharge Summary (Signed)
Physician Discharge Summary  Devin Mcdonald ZOX:[id number] DOB: 05-03-1956 DOA: 05/08/2012  PCP: No primary provider on file.  Admit date: 05/08/2012 Discharge date: 05/11/2012  Recommendations for Outpatient Follow-up:  1. Pt will need to follow up with PCP in 2 weeks post discharge 2. Please obtain BMP to evaluate electrolytes and kidney function 3. Please also check CBC to evaluate Hg and Hct levels  Discharge Diagnoses:  Principal Problem:  *Abscess of left knee Active Problems:  Renal insufficiency  Dehydration  Gout attack  1. Cellulitis LLE: Patient presented with progressive redness, swelling and pain of LLE, complicating a couple of boils over the left knee. Managing with iv Vancomycin day# 3/Zosyn day#2. Blood cultures are pending. Abscess culture grew MRSA.  He has remained afebrile. -home with zyvox x 11 days to make 14 days total 2. Abscess of left knee: This was the culprit for #1 above. Patient is s/p I&D done by ED MD. Abscess cultures are pending. Consulted [facility] for wound care. HIV test is non-reactive.  -pt has follow up appointment at [facility] wound care clinic 05/12/12 3. Dehydration/AKI. Patient had creatinine of 1.43 at presentation, BUN 19. This is likely secondary to mild dehydration. Managed with iv fluids, and creatinine was 1.26 on 05/09/12. IV fluids were discontinued accordingly.  4. Gout: Patient has known history of gout, with periodic flares. On 05/09/12, complained of pain in the forefoot, reminiscent of similar previous flares.  --d/c home with prednisone 40mg  once daily x 3 more days  Discharge Condition: stable  Disposition: home  Diet:cardiac Wt Readings from Last 3 Encounters:  05/08/12 124.739 kg (275 lb)   Brief narrative:  57 y.o. 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 He has no primary care provider, but goes to the [place] walk-in clinic as needed.  Last week, he noticed several boils in the left knee area, followed by erythema and swelling extending up to the groin and down to the ankle. He was seen at the walk-in clinic and given a prescription for Cipro. He was told to come to the emergency room should the infection worsened over the weekend. One of the boils started draining, so he came to the emergency room. The largest area of fluctuance, just over the patella was incised by the ED staff, and drainage sent for culture. Patient had fevers and chills a few nights ago but none since admission. He has difficulty flexing his knee due to swelling. He is able to bear weight. The patient was placed on intravenous vancomycin after blood cultures were obtained. Blood cultures are negative at the time of discharge. The patient erythema and cellulitis on his left leg improved significantly. The patient stated that his pain, erythema, and edema improved significantly. The patient will be discharged on Zyvox 600 mg by mouth twice a day x11 days. He was given an appointment to followup at the [facility] long wound care clinic on 05/12/2012. He was seen by the wound care nurse who made recommendations and wound care was provided. The patient was given intravenous fluids with improvement of his renal function. His serum creatinine at the time of discharge was 1.16. For the patient's gout he was started on prednisone 40 mg daily with improvement of his pain. He'll be discharged home with 3 additional days of prednisone 40 mg daily. The patient was advised to minimize NSAID use due to his renal insufficiency. He was strongly advised  to followup with primary care physician regarding his chronic medical issues.    Discharge Exam: Filed Vitals:   05/11/12 0541  BP: 121/63  Pulse: 62  Temp: 97.9 F (36.6 C)  Resp: 20   Filed Vitals:   05/09/12 1400 05/09/12 2200 05/10/12 2149 05/11/12 0541  BP: 114/68 123/74 120/61 121/63  Pulse: 89 79 68 62  Temp: 97.8 F (36.6  C) 98.1 F (36.7 C) 98.6 F (37 C) 97.9 F (36.6 C)  TempSrc: Oral Oral Oral Oral  Resp: 18 18 18 20   Height:      Weight:      SpO2: 94% 97% 98% 100%   General: A&O x 3, NAD, pleasant, cooperative Cardiovascular: RRR, no rub, no gallop, no S3 Respiratory: CTAB, no wheeze, no rhonchi Abdomen:soft, nontender, nondistended, positive bowel sounds Extremities: Left lower extremity without any lymphangitis, crepitance, necrosis. Punctate areas on his left knee prepatellar area with purulent drainage without any necrosis, crepitance.  Discharge Instructions      Discharge Orders    Future Orders Please Complete By Expires   Diet - low sodium heart healthy      Increase activity slowly      Discharge instructions      Comments:   Finish 11 days of Zyvox--take every pill Go to [facility] tomorrow       Medication List     As of 05/11/2012 12:35 PM    STOP taking these medications         ciprofloxacin 500 MG tablet   Commonly known as: CIPRO      TAKE these medications         linezolid 600 MG tablet   Commonly known as: ZYVOX   Take 1 tablet (600 mg total) by mouth every 12 (twelve) hours.      predniSONE 20 MG tablet   Commonly known as: DELTASONE   Take 2 tablets (40 mg total) by mouth daily with breakfast.          The results of significant diagnostics from this hospitalization (including imaging, microbiology, ancillary and laboratory) are listed below for reference.    Significant Diagnostic Studies: No results found.   Microbiology: Recent Results (from the past 240 hour(s))  CULTURE, ROUTINE-ABSCESS     Status: Normal   Collection Time   05/08/12  7:51 PM      Component Value Range Status Comment   Specimen Description KNEE JOINT   Final    Special Requests NONE   Final    Gram Stain     Final    Value: NO WBC SEEN     NO SQUAMOUS EPITHELIAL CELLS SEEN     FEW GRAM POSITIVE COCCI     IN PAIRS   Culture     Final     Value: MODERATE METHICILLIN RESISTANT STAPHYLOCOCCUS AUREUS     Note: RIFAMPIN AND GENTAMICIN SHOULD NOT BE USED AS SINGLE DRUGS FOR TREATMENT OF STAPH INFECTIONS. This organism DOES NOT demonstrate inducible Clindamycin resistance in vitro. CRITICAL RESULT CALLED TO, READ BACK BY AND VERIFIED WITH: [facility] MEANS      05/11/12 0820 BY [email or web address]   Report Status 05/11/2012 FINAL   Final    Organism ID, Bacteria METHICILLIN RESISTANT STAPHYLOCOCCUS AUREUS   Final   CULTURE, BLOOD (ROUTINE X 2)     Status: Normal (Preliminary result)   Collection Time   05/09/12 10:50 AM      Component Value Range  Status Comment   Specimen Description BLOOD LEFT ARM   Final    Special Requests BOTTLES DRAWN AEROBIC AND ANAEROBIC 5CC   Final    Culture  Setup Time 05/09/2012 14:15   Final    Culture     Final    Value:        BLOOD CULTURE RECEIVED NO GROWTH TO DATE CULTURE WILL BE HELD FOR 5 DAYS BEFORE ISSUING A FINAL NEGATIVE REPORT   Report Status PENDING   Incomplete   CULTURE, BLOOD (ROUTINE X 2)     Status: Normal (Preliminary result)   Collection Time   05/09/12 10:55 AM      Component Value Range Status Comment   Specimen Description BLOOD LEFT ARM   Final    Special Requests BOTTLES DRAWN AEROBIC AND ANAEROBIC 5CC   Final    Culture  Setup Time 05/09/2012 14:18   Final    Culture     Final    Value:        BLOOD CULTURE RECEIVED NO GROWTH TO DATE CULTURE WILL BE HELD FOR 5 DAYS BEFORE ISSUING A FINAL NEGATIVE REPORT   Report Status PENDING   Incomplete      Labs: Basic Metabolic Panel:  Lab 05/11/12 1610 05/10/12 0428 05/09/12 0430 05/08/12 1945  NA 137 137 137 136  K 4.0 4.2 -- --  CL 106 107 104 101  CO2 23 22 22 23   GLUCOSE 96 116* 99 127*  BUN 19 19 17 19   CREATININE 1.16 1.29 1.26 1.43*  CALCIUM 8.8 8.9 8.5 9.5  MG -- -- -- --  PHOS -- -- -- --   Liver Function Tests: No results found for this basename: AST:5,ALT:5,ALKPHOS:5,BILITOT:5,PROT:5,ALBUMIN:5 in the last 168 hours No  results found for this basename: LIPASE:5,AMYLASE:5 in the last 168 hours No results found for this basename: AMMONIA:5 in the last 168 hours CBC:  Lab 05/11/12 0438 05/10/12 0428 05/09/12 1050 05/08/12 1945  WBC 10.4 10.7* 10.0 13.0*  NEUTROABS -- -- -- 10.3*  HGB 12.2* 12.1* 12.8* 14.1  HCT 36.0* 34.9* 37.3* 41.6  MCV 88.2 87.3 88.2 88.9  PLT 271 258 248 236   Cardiac Enzymes: No results found for this basename: CKTOTAL:5,CKMB:5,CKMBINDEX:5,TROPONINI:5 in the last 168 hours BNP: No components found with this basename: POCBNP:5 CBG: No results found for this basename: GLUCAP:5 in the last 168 hours  Time coordinating discharge:  Greater than 30 minutes  Signed:  Nichoals Heyde, DO [facility] Pager: [phone] 05/11/2012, 12:35 PM

## 2012-05-11 NOTE — Progress Notes (Signed)
Pt discharged home, discharged instructions reviewed with patient, prescriptions called in to pharmacy by MD patient to pick up, dressing changed prior to discharged and he is to follow up with wound clinic on Friday 05-12-2012 13:32pm Means, [employer] RN 05-11-2012

## 2012-05-12 ENCOUNTER — Encounter (HOSPITAL_BASED_OUTPATIENT_CLINIC_OR_DEPARTMENT_OTHER): Payer: BC Managed Care – PPO | Attending: Internal Medicine

## 2012-05-12 DIAGNOSIS — L02419 Cutaneous abscess of limb, unspecified: Secondary | ICD-10-CM | POA: Insufficient documentation

## 2012-05-12 DIAGNOSIS — M109 Gout, unspecified: Secondary | ICD-10-CM | POA: Insufficient documentation

## 2012-05-12 DIAGNOSIS — Z8614 Personal history of Methicillin resistant Staphylococcus aureus infection: Secondary | ICD-10-CM | POA: Insufficient documentation

## 2012-05-12 NOTE — Progress Notes (Signed)
[facility]  NAME:  EFSTATHIOS, SAWIN                  ACCOUNT NO.:  MEDICAL RECORD NO.:  [id number]      DATE OF BIRTH:  [birth year]  PHYSICIAN:  Maxwell Caul, M.D. VISIT DATE:  05/12/2012                                  OFFICE VISIT   LOCATION:  [facility].  Mr. Bloom is a 57 year old man who was discharged from the hospital yesterday after a stay from May 08, 2012 through May 11, 2012. His history is that, the day after Christmas he developed 2 boils just under his left knee.  Both of these opened, 1 of them actually closed over; however, the other one did not.  This started to drain and pain redness and swelling spread up his leg fairly rapidly.  He was admitted to hospital for this.  Cultures of these areas showed MRSA.  He is not aware of any trauma to the area that could have punctured his skin.  He was managed with IV antibiotics.  His creatinine was 1.26.  His only past history is that of gout with a mildly elevated uric acid according to the patient, although it does not sound as though he has ever been on anything prophylactically.  His gout is only ever involved his feet.  In the hospital he was given a prednisone taper for pain across his metatarsal heads, left greater than right.  He does not think this involved the knee.  There was no suggestion of a knee effusion; however, there was considerable erythema of his leg which is marked obviously by somebody in the hospital.  He was discharged on Zyvox 600 mg twice a day for a further of 11 days. He is generally feeling better.  No systemic symptoms.  No fever.  His discharge creatinine was 1.16.  PAST MEDICAL HISTORY:  Negative for prior problems with MRSA.  He is not on anything for his gout.  He basically uses p.r.n. medications for periodic flares.  CURRENT MEDICATIONS:  Zyvox 600 every 12 and prednisone 20 every 4 hours.  SOCIAL HISTORY:  The patient is a  [employer], travels to [facility] for weekly trips.  This has interrupted his working which he thinks he can manage for the next week to 2.  PHYSICAL EXAMINATION:  VITAL SIGNS:  On examination temperature is 98.2, pulse 78, respirations 18, blood pressure 147/87. WOUND EXAM:  The area in question is over his left knee, roughly over the prepatellar bursa.  There are small open areas here which are still draining.  One of these probes 5 cm laterally.  There does not appear to be any involvement of the left knee.  I could not convince myself of an effusion in left knee.  The marks up into his left thigh especially medially indicate marked improvement from what was present when he entered the hospital even 3-4 days ago.  There is no erythema other than the 50 cent piece sized area over the left knee. RESPIRATORY:  Clear air entry bilaterally. CARDIAC:  No murmurs.  IMPRESSIONS: 1. Abscess involving the prepatellar area of the left knee.  This is     not involve the joint, but is still probing a considerable distance     (see above.)  There is still purulent drainage.  The area of the     erythema up into his mid thigh that was marked in the hospital     shows marked improvement.  I have urged him to continue the Zyvox     as ordered.  He may actually need another week of this; however, I     will see him next week and make this judgement.  We have packed the     draining abscess with Iodoform gauze.  Given him some to change     this every 2nd day with an occlusive dressing.  I do not think this     is going to be more complicated than this.  Although presence of     MRSA in a bursa sometimes can be a protracted issue. 2. Gout, everything seems to have calmed down here.  He has an     elevated uric acid level.  He is probably going to need long-term     gout prophylaxis.  He tells me he has an appointment with a     physician to follow up on this.           ______________________________ Maxwell Caul, M.D.     MGR/MEDQ  D:  05/12/2012  T:  05/12/2012  Job:  [id number]

## 2012-05-15 LAB — CULTURE, BLOOD (ROUTINE X 2): Culture: NO GROWTH

## 2012-06-09 ENCOUNTER — Encounter (HOSPITAL_BASED_OUTPATIENT_CLINIC_OR_DEPARTMENT_OTHER): Payer: BC Managed Care – PPO | Attending: Internal Medicine

## 2012-06-09 DIAGNOSIS — L02419 Cutaneous abscess of limb, unspecified: Secondary | ICD-10-CM | POA: Insufficient documentation

## 2013-02-14 ENCOUNTER — Other Ambulatory Visit: Payer: Self-pay | Admitting: Orthopedic Surgery

## 2013-02-15 ENCOUNTER — Encounter (HOSPITAL_BASED_OUTPATIENT_CLINIC_OR_DEPARTMENT_OTHER): Payer: Self-pay | Admitting: *Deleted

## 2013-02-15 NOTE — Progress Notes (Signed)
To come in for bmet-on allopurinol with hx renal insuff

## 2013-02-16 ENCOUNTER — Encounter (HOSPITAL_BASED_OUTPATIENT_CLINIC_OR_DEPARTMENT_OTHER)
Admission: RE | Admit: 2013-02-16 | Discharge: 2013-02-16 | Disposition: A | Discharge status: Home / Self Care | Payer: Worker's Compensation | Source: Ambulatory Visit | Attending: Orthopedic Surgery | Admitting: Orthopedic Surgery

## 2013-02-16 DIAGNOSIS — Z01812 Encounter for preprocedural laboratory examination: Secondary | ICD-10-CM | POA: Diagnosis present

## 2013-02-16 LAB — BASIC METABOLIC PANEL
Chloride: 100 mEq/L (ref 96–112)
GFR calc Af Amer: 75 mL/min — ABNORMAL LOW (ref >90)
Potassium: 4.8 mEq/L (ref 3.5–5.1)
Sodium: 136 mEq/L (ref 135–145)

## 2013-02-20 ENCOUNTER — Encounter (HOSPITAL_BASED_OUTPATIENT_CLINIC_OR_DEPARTMENT_OTHER): Payer: Worker's Compensation | Admitting: Certified Registered Nurse Anesthetist

## 2013-02-20 ENCOUNTER — Ambulatory Visit (HOSPITAL_BASED_OUTPATIENT_CLINIC_OR_DEPARTMENT_OTHER)
Admission: RE | Admit: 2013-02-20 | Discharge: 2013-02-20 | Disposition: A | Discharge status: Home / Self Care | Payer: Worker's Compensation | Source: Ambulatory Visit | Attending: Orthopedic Surgery | Admitting: Orthopedic Surgery

## 2013-02-20 ENCOUNTER — Ambulatory Visit (HOSPITAL_BASED_OUTPATIENT_CLINIC_OR_DEPARTMENT_OTHER): Payer: Worker's Compensation | Admitting: Certified Registered Nurse Anesthetist

## 2013-02-20 ENCOUNTER — Encounter (HOSPITAL_BASED_OUTPATIENT_CLINIC_OR_DEPARTMENT_OTHER)
Admission: RE | Disposition: A | Discharge status: Home / Self Care | Payer: Self-pay | Source: Ambulatory Visit | Attending: Orthopedic Surgery

## 2013-02-20 ENCOUNTER — Encounter (HOSPITAL_BASED_OUTPATIENT_CLINIC_OR_DEPARTMENT_OTHER): Payer: Self-pay

## 2013-02-20 DIAGNOSIS — M2240 Chondromalacia patellae, unspecified knee: Secondary | ICD-10-CM | POA: Insufficient documentation

## 2013-02-20 DIAGNOSIS — Y9269 Other specified industrial and construction area as the place of occurrence of the external cause: Secondary | ICD-10-CM | POA: Insufficient documentation

## 2013-02-20 DIAGNOSIS — IMO0002 Reserved for concepts with insufficient information to code with codable children: Secondary | ICD-10-CM | POA: Insufficient documentation

## 2013-02-20 DIAGNOSIS — M109 Gout, unspecified: Secondary | ICD-10-CM | POA: Insufficient documentation

## 2013-02-20 DIAGNOSIS — M19019 Primary osteoarthritis, unspecified shoulder: Secondary | ICD-10-CM | POA: Insufficient documentation

## 2013-02-20 DIAGNOSIS — M75102 Unspecified rotator cuff tear or rupture of left shoulder, not specified as traumatic: Secondary | ICD-10-CM

## 2013-02-20 DIAGNOSIS — S43429A Sprain of unspecified rotator cuff capsule, initial encounter: Secondary | ICD-10-CM | POA: Insufficient documentation

## 2013-02-20 DIAGNOSIS — Y990 Civilian activity done for income or pay: Secondary | ICD-10-CM | POA: Insufficient documentation

## 2013-02-20 DIAGNOSIS — M2340 Loose body in knee, unspecified knee: Secondary | ICD-10-CM | POA: Insufficient documentation

## 2013-02-20 DIAGNOSIS — X58XXXA Exposure to other specified factors, initial encounter: Secondary | ICD-10-CM | POA: Insufficient documentation

## 2013-02-20 HISTORY — PX: SHOULDER ARTHROSCOPY WITH ROTATOR CUFF REPAIR: SHX5685

## 2013-02-20 HISTORY — DX: Unspecified osteoarthritis, unspecified site: M19.90

## 2013-02-20 HISTORY — DX: Presence of dental prosthetic device (complete) (partial): Z97.2

## 2013-02-20 HISTORY — DX: Personal history of other diseases of urinary system: Z87.448

## 2013-02-20 HISTORY — DX: Presence of spectacles and contact lenses: Z97.3

## 2013-02-20 HISTORY — DX: Gout, unspecified: M10.9

## 2013-02-20 LAB — POCT HEMOGLOBIN-HEMACUE: Hemoglobin: 14.8 g/dL (ref 13.0–17.0)

## 2013-02-20 SURGERY — ARTHROSCOPY, SHOULDER, WITH ROTATOR CUFF REPAIR
Anesthesia: General | Site: Shoulder | Laterality: Left | Wound class: Clean

## 2013-02-20 MED ORDER — CEFAZOLIN SODIUM-DEXTROSE 2-3 GM-% IV SOLR
INTRAVENOUS | Status: AC
  Filled 2013-02-20: qty 50

## 2013-02-20 MED ORDER — DEXAMETHASONE SODIUM PHOSPHATE 4 MG/ML IJ SOLN
INTRAMUSCULAR | Status: DC | PRN
  Administered 2013-02-20: 10 mg via INTRAVENOUS

## 2013-02-20 MED ORDER — MIDAZOLAM HCL 2 MG/2ML IJ SOLN
INTRAMUSCULAR | Status: AC
  Filled 2013-02-20: qty 2

## 2013-02-20 MED ORDER — MIDAZOLAM HCL 5 MG/5ML IJ SOLN
INTRAMUSCULAR | Status: DC | PRN
  Administered 2013-02-20: 1 mg via INTRAVENOUS

## 2013-02-20 MED ORDER — LIDOCAINE HCL (CARDIAC) 20 MG/ML IV SOLN
INTRAVENOUS | Status: DC | PRN
  Administered 2013-02-20: 30 mg via INTRAVENOUS

## 2013-02-20 MED ORDER — FENTANYL CITRATE 0.05 MG/ML IJ SOLN
INTRAMUSCULAR | Status: DC | PRN
  Administered 2013-02-20: 25 ug via INTRAVENOUS
  Administered 2013-02-20: 50 ug via INTRAVENOUS
  Administered 2013-02-20: 25 ug via INTRAVENOUS

## 2013-02-20 MED ORDER — CEFAZOLIN SODIUM-DEXTROSE 2-3 GM-% IV SOLR
2.0000 g | INTRAVENOUS | Status: AC
  Administered 2013-02-20: 2 g via INTRAVENOUS

## 2013-02-20 MED ORDER — SODIUM CHLORIDE 0.9 % IR SOLN
Status: DC | PRN
  Administered 2013-02-20: 6000 mL

## 2013-02-20 MED ORDER — MIDAZOLAM HCL 2 MG/2ML IJ SOLN
1.0000 mg | INTRAMUSCULAR | Status: DC | PRN
  Administered 2013-02-20: 2 mg via INTRAVENOUS

## 2013-02-20 MED ORDER — ONDANSETRON HCL 4 MG/2ML IJ SOLN
INTRAMUSCULAR | Status: DC | PRN
  Administered 2013-02-20: 4 mg via INTRAMUSCULAR

## 2013-02-20 MED ORDER — POVIDONE-IODINE 7.5 % EX SOLN: Freq: Once | CUTANEOUS | Status: DC

## 2013-02-20 MED ORDER — KETOROLAC TROMETHAMINE 30 MG/ML IJ SOLN: 15.0000 mg | Freq: Once | INTRAMUSCULAR | Status: DC | PRN

## 2013-02-20 MED ORDER — FENTANYL CITRATE 0.05 MG/ML IJ SOLN
INTRAMUSCULAR | Status: AC
  Filled 2013-02-20: qty 2

## 2013-02-20 MED ORDER — PROPOFOL 10 MG/ML IV BOLUS
INTRAVENOUS | Status: DC | PRN
  Administered 2013-02-20: 200 mg via INTRAVENOUS

## 2013-02-20 MED ORDER — LACTATED RINGERS IV SOLN
INTRAVENOUS | Status: DC
  Administered 2013-02-20 (×2): via INTRAVENOUS

## 2013-02-20 MED ORDER — PROPOFOL 10 MG/ML IV BOLUS
INTRAVENOUS | Status: AC
  Filled 2013-02-20: qty 20

## 2013-02-20 MED ORDER — FENTANYL CITRATE 0.05 MG/ML IJ SOLN
50.0000 ug | Freq: Once | INTRAMUSCULAR | Status: AC
  Administered 2013-02-20: 100 ug via INTRAVENOUS

## 2013-02-20 MED ORDER — HYDROMORPHONE HCL PF 1 MG/ML IJ SOLN: 0.2500 mg | INTRAMUSCULAR | Status: DC | PRN

## 2013-02-20 MED ORDER — ONDANSETRON HCL 4 MG/2ML IJ SOLN: 4.0000 mg | Freq: Once | INTRAMUSCULAR | Status: DC | PRN

## 2013-02-20 MED ORDER — SUCCINYLCHOLINE CHLORIDE 20 MG/ML IJ SOLN
INTRAMUSCULAR | Status: DC | PRN
  Administered 2013-02-20: 100 mg via INTRAVENOUS

## 2013-02-20 MED ORDER — OXYCODONE-ACETAMINOPHEN 5-325 MG PO TABS: 1.0000 | ORAL_TABLET | ORAL | Status: DC | PRN

## 2013-02-20 MED ORDER — FENTANYL CITRATE 0.05 MG/ML IJ SOLN
INTRAMUSCULAR | Status: AC
  Filled 2013-02-20: qty 6

## 2013-02-20 MED ORDER — DOCUSATE SODIUM 100 MG PO CAPS: 100.0000 mg | ORAL_CAPSULE | Freq: Three times a day (TID) | ORAL | Status: DC | PRN

## 2013-02-20 SURGICAL SUPPLY — 77 items
ANCHOR PEEK 4.75X19.1 SWLK C (Anchor) ×2 IMPLANT
BENZOIN TINCTURE PRP APPL 2/3 (GAUZE/BANDAGES/DRESSINGS) IMPLANT
BLADE SURG 15 STRL LF DISP TIS (BLADE) IMPLANT
BLADE SURG 15 STRL SS (BLADE)
BLADE SURG ROTATE 9660 (MISCELLANEOUS) IMPLANT
BUR OVAL 4.0 (BURR) ×2 IMPLANT
CANISTER OMNI JUG 16 LITER (MISCELLANEOUS) ×2 IMPLANT
CANISTER SUCT 3000ML (MISCELLANEOUS) IMPLANT
CANNULA 5.75X71 LONG (CANNULA) ×2 IMPLANT
CANNULA TWIST IN 8.25X7CM (CANNULA) ×2 IMPLANT
CHLORAPREP W/TINT 26ML (MISCELLANEOUS) ×2 IMPLANT
DECANTER SPIKE VIAL GLASS SM (MISCELLANEOUS) IMPLANT
DERMABOND ADVANCED (GAUZE/BANDAGES/DRESSINGS)
DERMABOND ADVANCED .7 DNX12 (GAUZE/BANDAGES/DRESSINGS) IMPLANT
DRAPE INCISE IOBAN 66X45 STRL (DRAPES) ×2 IMPLANT
DRAPE STERI 35X30 U-POUCH (DRAPES) ×2 IMPLANT
DRAPE SURG 17X23 STRL (DRAPES) ×2 IMPLANT
DRAPE U 20/CS (DRAPES) ×2 IMPLANT
DRAPE U-SHAPE 47X51 STRL (DRAPES) ×2 IMPLANT
DRAPE U-SHAPE 76X120 STRL (DRAPES) ×4 IMPLANT
DRSG PAD ABDOMINAL 8X10 ST (GAUZE/BANDAGES/DRESSINGS) ×2 IMPLANT
ELECT REM PT RETURN 9FT ADLT (ELECTROSURGICAL)
ELECTRODE REM PT RTRN 9FT ADLT (ELECTROSURGICAL) IMPLANT
GAUZE SPONGE 4X4 16PLY XRAY LF (GAUZE/BANDAGES/DRESSINGS) IMPLANT
GAUZE XEROFORM 1X8 LF (GAUZE/BANDAGES/DRESSINGS) ×2 IMPLANT
GLOVE BIO SURGEON STRL SZ 6.5 (GLOVE) ×6 IMPLANT
GLOVE BIO SURGEON STRL SZ7 (GLOVE) ×2 IMPLANT
GLOVE BIO SURGEON STRL SZ7.5 (GLOVE) ×2 IMPLANT
GLOVE BIOGEL PI IND STRL 7.0 (GLOVE) ×3 IMPLANT
GLOVE BIOGEL PI IND STRL 8 (GLOVE) ×1 IMPLANT
GLOVE BIOGEL PI INDICATOR 7.0 (GLOVE) ×3
GLOVE BIOGEL PI INDICATOR 8 (GLOVE) ×1
GOWN BRE IMP PREV XXLGXLNG (GOWN DISPOSABLE) ×2 IMPLANT
GOWN PREVENTION PLUS XLARGE (GOWN DISPOSABLE) ×6 IMPLANT
IV NS IRRIG 3000ML ARTHROMATIC (IV SOLUTION) ×4 IMPLANT
LASSO CRESCENT QUICKPASS (SUTURE) ×2 IMPLANT
NDL SUT 6 .5 CRC .975X.05 MAYO (NEEDLE) IMPLANT
NEEDLE 1/2 CIR CATGUT .05X1.09 (NEEDLE) IMPLANT
NEEDLE MAYO TAPER (NEEDLE)
NEEDLE SCORPION MULTI FIRE (NEEDLE) IMPLANT
NS IRRIG 1000ML POUR BTL (IV SOLUTION) IMPLANT
PACK ARTHROSCOPY DSU (CUSTOM PROCEDURE TRAY) ×2 IMPLANT
PACK BASIN DAY SURGERY FS (CUSTOM PROCEDURE TRAY) ×2 IMPLANT
PENCIL BUTTON HOLSTER BLD 10FT (ELECTRODE) IMPLANT
RESECTOR FULL RADIUS 4.2MM (BLADE) ×2 IMPLANT
SLEEVE SCD COMPRESS KNEE MED (MISCELLANEOUS) ×2 IMPLANT
SLING ARM FOAM STRAP LRG (SOFTGOODS) IMPLANT
SLING ARM FOAM STRAP MED (SOFTGOODS) IMPLANT
SLING ARM FOAM STRAP XLG (SOFTGOODS) IMPLANT
SLING ARM IMMOBILIZER MED (SOFTGOODS) IMPLANT
SPONGE GAUZE 4X4 12PLY (GAUZE/BANDAGES/DRESSINGS) ×2 IMPLANT
SPONGE LAP 4X18 X RAY DECT (DISPOSABLE) IMPLANT
STRIP CLOSURE SKIN 1/2X4 (GAUZE/BANDAGES/DRESSINGS) IMPLANT
SUCTION FRAZIER TIP 10 FR DISP (SUCTIONS) IMPLANT
SUPPORT WRAP ARM LG (MISCELLANEOUS) IMPLANT
SUT 2 FIBERLOOP 20 STRT BLUE (SUTURE)
SUT BONE WAX W31G (SUTURE) IMPLANT
SUT ETHILON 3 0 PS 1 (SUTURE) ×2 IMPLANT
SUT FIBERWIRE #2 38 T-5 BLUE (SUTURE)
SUT MNCRL AB 3-0 PS2 18 (SUTURE) IMPLANT
SUT MNCRL AB 4-0 PS2 18 (SUTURE) IMPLANT
SUT PDS AB 0 CT 36 (SUTURE) ×2 IMPLANT
SUT PROLENE 3 0 PS 2 (SUTURE) IMPLANT
SUT VIC AB 0 CT1 27 (SUTURE)
SUT VIC AB 0 CT1 27XBRD ANBCTR (SUTURE) IMPLANT
SUT VIC AB 2-0 SH 27 (SUTURE)
SUT VIC AB 2-0 SH 27XBRD (SUTURE) IMPLANT
SUTURE 2 FIBERLOOP 20 STRT BLU (SUTURE) IMPLANT
SUTURE FIBERWR #2 38 T-5 BLUE (SUTURE) IMPLANT
SYR BULB 3OZ (MISCELLANEOUS) IMPLANT
TOWEL OR 17X24 6PK STRL BLUE (TOWEL DISPOSABLE) ×2 IMPLANT
TOWEL OR NON WOVEN STRL DISP B (DISPOSABLE) IMPLANT
TUBE CONNECTING 20X1/4 (TUBING) ×4 IMPLANT
TUBING ARTHROSCOPY IRRIG 16FT (MISCELLANEOUS) ×2 IMPLANT
WAND STAR VAC 90 (SURGICAL WAND) ×2 IMPLANT
WATER STERILE IRR 1000ML POUR (IV SOLUTION) ×2 IMPLANT
YANKAUER SUCT BULB TIP NO VENT (SUCTIONS) IMPLANT

## 2013-02-20 NOTE — Anesthesia Postprocedure Evaluation (Signed)
  Anesthesia Post-op Note  Patient: Devin Mcdonald  Procedure(s) Performed: Procedure(s): SHOULDER ARTHROSCOPY WITH ARTHROSCOPIC  ROTATOR CUFF REPAIR, SUBACROMIAL DECOMPRESSION, DEBRIDEMENT OF ARTHRITIS (Left)  Patient Location: PACU  Anesthesia Type:General and GA combined with regional for post-op pain  Level of Consciousness: awake, alert  and oriented  Airway and Oxygen Therapy: Patient Spontanous Breathing  Post-op Pain: none  Post-op Assessment: Post-op Vital signs reviewed, Patient's Cardiovascular Status Stable, Respiratory Function Stable, Patent Airway and Pain level controlled  Post-op Vital Signs: stable  Complications: No apparent anesthesia complications

## 2013-02-20 NOTE — Anesthesia Preprocedure Evaluation (Addendum)
Anesthesia Evaluation  Patient identified by MRN, date of birth, ID band Patient awake    Reviewed: Allergy & Precautions, NPO status   Airway Mallampati: II TM Distance: >3 FB Neck ROM: Full    Dental  (+) Caps and Implants   Pulmonary  breath sounds clear to auscultation        Cardiovascular Rhythm:Regular Rate:Normal     Neuro/Psych    GI/Hepatic   Endo/Other    Renal/GU      Musculoskeletal   Abdominal   Peds  Hematology   Anesthesia Other Findings   Reproductive/Obstetrics                          Anesthesia Physical Anesthesia Plan  ASA: II  Anesthesia Plan: General   Post-op Pain Management:    Induction: Intravenous  Airway Management Planned: Oral ETT  Additional Equipment:   Intra-op Plan:   Post-operative Plan: Extubation in OR  Informed Consent: I have reviewed the patients History and Physical, chart, labs and discussed the procedure including the risks, benefits and alternatives for the proposed anesthesia with the patient or authorized representative who has indicated his/her understanding and acceptance.   Dental advisory given  Plan Discussed with: CRNA and Anesthesiologist  Anesthesia Plan Comments:         Anesthesia Quick Evaluation

## 2013-02-20 NOTE — Transfer of Care (Signed)
Immediate Anesthesia Transfer of Care Note  Patient: Devin Mcdonald  Procedure(s) Performed: Procedure(s): SHOULDER ARTHROSCOPY WITH ARTHROSCOPIC  ROTATOR CUFF REPAIR, SUBACROMIAL DECOMPRESSION, DEBRIDEMENT OF ARTHRITIS (Left)  Patient Location: PACU  Anesthesia Type:GA combined with regional for post-op pain  Level of Consciousness: awake and patient cooperative  Airway & Oxygen Therapy: Patient Spontanous Breathing and Patient connected to face mask oxygen  Post-op Assessment: Report given to PACU RN and Post -op Vital signs reviewed and stable  Post vital signs: Reviewed and stable  Complications: No apparent anesthesia complications

## 2013-02-20 NOTE — Anesthesia Procedure Notes (Addendum)
Anesthesia Regional Block:  Interscalene brachial plexus block  Pre-Anesthetic Checklist: ,, timeout performed, Correct Patient, Correct Site, Correct Laterality, Correct Procedure, Correct Position, site marked, Risks and benefits discussed,  Surgical consent,  Pre-op evaluation,  At surgeon's request and post-op pain management  Laterality: Left  Prep: chloraprep       Needles:   Needle Type: Echogenic Stimulator Needle      Needle Gauge: 22 and 22 G    Additional Needles:  Procedures: ultrasound guided (picture in chart) and nerve stimulator Interscalene brachial plexus block  Nerve Stimulator or Paresthesia:  Response: 0.5 mA,   Additional Responses:   Narrative:  Start time: 02/20/2013 12:10 PM End time: 02/20/2013 12:15 PM Injection made incrementally with aspirations every 5 mL.  Performed by: Personally   Additional Notes: 25 cc 0.5% marcaine 1:200 Epi injected easily   Procedure Name: Intubation Date/Time: 02/20/2013 12:56 PM Performed by: Mable Paris Pre-anesthesia Checklist: Patient identified, Emergency Drugs available, Suction available and Patient being monitored Patient Re-evaluated:Patient Re-evaluated prior to inductionOxygen Delivery Method: Circle System Utilized Preoxygenation: Pre-oxygenation with 100% oxygen Intubation Type: IV induction Ventilation: Mask ventilation without difficulty Laryngoscope Size: Mac and 3 Grade View: Grade II Tube type: Oral Tube size: 7.0 mm Number of attempts: 1 Airway Equipment and Method: stylet and oral airway Placement Confirmation: ETT inserted through vocal cords under direct vision,  positive ETCO2 and breath sounds checked- equal and bilateral Secured at: 22 cm Tube secured with: Tape Dental Injury: Teeth and Oropharynx as per pre-operative assessment

## 2013-02-20 NOTE — Progress Notes (Signed)
Assisted Dr. Joslin with left, ultrasound guided, interscalene  block. Side rails up, monitors on throughout procedure. See vital signs in flow sheet. Tolerated Procedure well. 

## 2013-02-20 NOTE — H&P (Signed)
Devin Mcdonald is an 57 y.o. male.   Chief Complaint: L shoulder pain and dysfunction HPI: s/p injury at work with L shoulder pain and dysfunction, failed nonop treatment.  Past Medical History  Diagnosis Date  . No pertinent past medical history   . Wears glasses   . History of renal insufficiency syndrome   . Gout   . Dental bridge present   . Arthritis     Past Surgical History  Procedure Laterality Date  . Arthroplasty      left knee 25 years ago  . Colonoscopy    . Tonsillectomy      History reviewed. No pertinent family history. Social History:  reports that he has never smoked. He has never used smokeless tobacco. He reports that he does not drink alcohol or use illicit drugs.  Allergies: No Known Allergies  Medications Prior to Admission  Medication Sig Dispense Refill  . allopurinol (ZYLOPRIM) 300 MG tablet Take 300 mg by mouth daily.        No results found for this or any previous visit (from the past 48 hour(s)). No results found.  Review of Systems  All other systems reviewed and are negative.    Blood pressure 137/86, pulse 86, temperature 98 F (36.7 C), temperature source Oral, resp. rate 20, height 6' (1.829 m), weight 124.286 kg (274 lb), SpO2 97.00%. Physical Exam  Constitutional: He is oriented to person, place, and time. He appears well-developed and well-nourished.  HENT:  Head: Atraumatic.  Eyes: EOM are normal.  Cardiovascular: Intact distal pulses.   Respiratory: Effort normal.  Musculoskeletal:  L shoulder pain with RC testing.TTP at [facility].  Neurological: He is alert and oriented to person, place, and time.  Skin: Skin is warm and dry.  Psychiatric: He has a normal mood and affect.     Assessment/Plan L shoulder partial thickness RCT, impingement  Plan arth SAD, RC debridement versus repair Risks / benefits of surgery discussed Consent on chart  NPO for OR Preop antibiotics   Devin Mcdonald 02/20/2013, 11:38  AM

## 2013-02-20 NOTE — Op Note (Signed)
Procedure(s): SHOULDER ARTHROSCOPY WITH ARTHROSCOPIC  ROTATOR CUFF REPAIR, SUBACROMIAL DECOMPRESSION, DEBRIDEMENT OF ARTHRITIS Procedure Note  Devin Mcdonald male 57 y.o. 02/20/2013  Procedure(s) and Anesthesia Type:    #1 left shoulder arthroscopic subscapularis repair    #2 left shoulder arthroscopic debridement articular sided supraspinatus tear    #3 left shoulder debridement glenohumeral arthritis    #4 left shoulder arthroscopic subacromial decompression  Surgeon(s) and Role:    * Mable Paris, MD - Primary     Surgeon: Mable Paris   Assistants: Devin Lack PA-C [facility] was present and scrubbed throughout the procedure and was essential in positioning, assisting with the camera and instrumentation,, and closure)  Anesthesia: General endotracheal anesthesia with preoperative interscalene   Procedure Detail  SHOULDER ARTHROSCOPY WITH ARTHROSCOPIC  ROTATOR CUFF REPAIR, SUBACROMIAL DECOMPRESSION, DEBRIDEMENT OF ARTHRITIS  Estimated Blood Loss: Min         Drains: none  Blood Given: none         Specimens: none        Complications:  * No complications entered in OR log *         Disposition: PACU - hemodynamically stable.         Condition: stable    Procedure:   INDICATIONS FOR SURGERY: The patient is 57 y.o. male who had an injury at work causing a left shoulder injury. He failed conservative management. MRI revealed partial thickness tear of supraspinatus with intramuscular cyst. Ultimately she failed conservative management and is indicated for surgical treatment.  OPERATIVE FINDINGS: Examination under anesthesia: No stiffness or instability Diagnostic Arthroscopy:  Glenoid articular cartilage: He had grade 3 and a small area of grade 4 chondral malacia on the central inferior aspect of the glenoid. This was debrided back to nondisplaceable edges. Humeral head articular cartilage: He had significant grade 4 chondromalacia in  an area on the posterior central humeral head measuring about 15 mm in diameter this was debrided back to non displaceable edges Labrum: There was extensive partial tearing of the superior and anterior labrum which was debrided back to healthy labrum. Loose bodies: There were several small cartilaginous loose bodies which were removed with the shaver to Synovitis:  significant Articular sided rotator cuff:  High grade partial tear the subscapularis with detachment of greater than 50% of the upper border. This was repaired using a fiber tape and 4.75 swivel lock anchor. partial-thickness tear involving just under 50% of the footprint of the tendon. This was debrided. There was noted to be a cystic fluid which emanated from the partial tear during the debridement. Bursal sided rotator cuff: Completely intact He had a moderate size anterior acromial spur that was addressed with a standard  acromioplasty  DESCRIPTION OF PROCEDURE: The patient was identified in preoperative  holding area where I personally marked the operative site after  verifying site, side, and procedure with the patient. An interscalene block was given by the attending anesthesiologist the holding area.  The patient was taken back to the operating room where general anesthesia was induced without complication and was placed in the beach-chair position with the back  elevated about 60 degrees and all extremities and head and neck carefully padded and  positioned.   The left upper extremity was then prepped and  draped in a standard sterile fashion. The appropriate time-out  procedure was carried out. The patient did receive IV antibiotics  within 30 minutes of incision.   A small posterior portal incision was made and the arthroscope  was introduced into the joint. An anterior portal was then established above the subscapularis using needle localization. Small cannula was placed anteriorly. Diagnostic arthroscopy was then carried  out with findings as described above.  He was immediately noted to have significant tearing of the subscapularis upper border involving greater than 50% of the tendon thickness. This was retracted about a centimeter. He was also noted to have extensive tearing of the superior and anterior labrum which was debrided back to healthy labrum. The remaining labrum was not detached and did not require formal repair. The biceps tendon was intact. Glenoid cartilage and humeral head cartilage were examined and found have significant chondromalacia as described above. Devin Mcdonald was used to debride this extensively. Small cartilaginous loose bodies were also noted and removed with a shaver. The posterior rotator cuff was intact. The superior rotator cuff was noted to have some partial-thickness tearing. There was a thin film of tissue on the inner layer of the tendon which was billowing and once opened there was some cystic fluid that came forth. Once the tendon was debrided it was determined it was less than 50% of the articular side of the footprint and did not appear to penetrate all the way through. A PDS suture was passed with a spinal needle through the point of maximal tear thickness..  The subscapularis was repaired using a large cannula anteriorly and a small cannula in the high lateral rotator interval portal position. Tendon easily reduced to the tuberosity. Therefore the tuberosity was prepared with ArthroCare and a bur. A fiber tape was passed in an inverted mattress configuration with a crescent suture lasso And this was brought into a 4.75 swivel lock anchor to bring the tendon back down to the prepared tuberosity. There is no significant tension on the repair. The extra #2 FiberWire from the anchor was used to secure the repair in the upper border.  The arthroscope was then introduced into the subacromial space a standard lateral portal was established with needle localization. The shaver was used through the  lateral portal to perform extensive bursectomy. Coracoacromial ligament was examined and found to be slightly thickened.  The bursal surface of the rotator cuff was carefully examined in the PDS suture was found. The entire bursal surface rotator cuff was intact without any partial or full-thickness tearing.  The coracoacromial ligament was taken down off the anterior acromion with the ArthroCare exposing a moderate down sloping anterior acromial spur. A high-speed bur was then used through the lateral portal to take down the anterior acromial spur from lateral to medial in a standard acromioplasty.  The acromioplasty was also viewed from the lateral portal and the bur was used as necessary to ensure that the acromion was completely flat from posterior to anterior.  The arthroscopic equipment was removed from the joint and the portals were closed with 3-0 nylon in an interrupted fashion. Sterile dressings were then applied including Xeroform 4 x 4's ABDs and tape. The patient was then allowed to awaken from general anesthesia, placed in a sling, transferred to the stretcher and taken to the recovery room in stable condition.   POSTOPERATIVE PLAN: The patient will be discharged home today and will followup in one week for suture removal and wound check.  He will follow the standard rotator cuff tear protocol.

## 2013-02-23 ENCOUNTER — Encounter (HOSPITAL_BASED_OUTPATIENT_CLINIC_OR_DEPARTMENT_OTHER): Payer: Self-pay | Admitting: Orthopedic Surgery

## 2013-07-11 ENCOUNTER — Other Ambulatory Visit: Payer: Self-pay | Admitting: Orthopedic Surgery

## 2013-07-26 ENCOUNTER — Encounter (HOSPITAL_BASED_OUTPATIENT_CLINIC_OR_DEPARTMENT_OTHER): Payer: Self-pay | Admitting: *Deleted

## 2013-07-26 NOTE — Progress Notes (Addendum)
Bring all medications. Plans to come in tomorrow for BMET. Called pt back and left message does not need BMET per Dr. Conrad Waterloo.

## 2013-07-31 ENCOUNTER — Ambulatory Visit (HOSPITAL_BASED_OUTPATIENT_CLINIC_OR_DEPARTMENT_OTHER): Payer: Worker's Compensation | Admitting: Anesthesiology

## 2013-07-31 ENCOUNTER — Encounter (HOSPITAL_BASED_OUTPATIENT_CLINIC_OR_DEPARTMENT_OTHER)
Admission: RE | Disposition: A | Discharge status: Home / Self Care | Payer: Self-pay | Source: Ambulatory Visit | Attending: Orthopedic Surgery

## 2013-07-31 ENCOUNTER — Ambulatory Visit (HOSPITAL_BASED_OUTPATIENT_CLINIC_OR_DEPARTMENT_OTHER)
Admission: RE | Admit: 2013-07-31 | Discharge: 2013-07-31 | Disposition: A | Discharge status: Home / Self Care | Payer: Worker's Compensation | Source: Ambulatory Visit | Attending: Orthopedic Surgery | Admitting: Orthopedic Surgery

## 2013-07-31 ENCOUNTER — Encounter (HOSPITAL_BASED_OUTPATIENT_CLINIC_OR_DEPARTMENT_OTHER): Payer: Self-pay | Admitting: *Deleted

## 2013-07-31 ENCOUNTER — Encounter (HOSPITAL_BASED_OUTPATIENT_CLINIC_OR_DEPARTMENT_OTHER): Payer: Worker's Compensation | Admitting: Anesthesiology

## 2013-07-31 DIAGNOSIS — Z96659 Presence of unspecified artificial knee joint: Secondary | ICD-10-CM | POA: Insufficient documentation

## 2013-07-31 DIAGNOSIS — Z9889 Other specified postprocedural states: Secondary | ICD-10-CM

## 2013-07-31 DIAGNOSIS — M719 Bursopathy, unspecified: Principal | ICD-10-CM | POA: Insufficient documentation

## 2013-07-31 DIAGNOSIS — M9420 Chondromalacia, unspecified site: Secondary | ICD-10-CM | POA: Insufficient documentation

## 2013-07-31 DIAGNOSIS — Z5333 Arthroscopic surgical procedure converted to open procedure: Secondary | ICD-10-CM | POA: Insufficient documentation

## 2013-07-31 DIAGNOSIS — M67919 Unspecified disorder of synovium and tendon, unspecified shoulder: Secondary | ICD-10-CM | POA: Insufficient documentation

## 2013-07-31 DIAGNOSIS — M19019 Primary osteoarthritis, unspecified shoulder: Secondary | ICD-10-CM | POA: Insufficient documentation

## 2013-07-31 HISTORY — PX: SHOULDER ARTHROSCOPY WITH BICEPS TENDON REPAIR: SHX5674

## 2013-07-31 LAB — POCT HEMOGLOBIN-HEMACUE: Hemoglobin: 14.5 g/dL (ref 13.0–17.0)

## 2013-07-31 SURGERY — SHOULDER ARTHROSCOPY WITH BICEPS TENDON REPAIR
Anesthesia: General | Site: Shoulder | Laterality: Left

## 2013-07-31 MED ORDER — MIDAZOLAM HCL 2 MG/2ML IJ SOLN
1.0000 mg | INTRAMUSCULAR | Status: DC | PRN
  Administered 2013-07-31: 2 mg via INTRAVENOUS

## 2013-07-31 MED ORDER — DEXAMETHASONE SODIUM PHOSPHATE 10 MG/ML IJ SOLN
INTRAMUSCULAR | Status: DC | PRN
  Administered 2013-07-31: 4 mg

## 2013-07-31 MED ORDER — SODIUM CHLORIDE 0.9 % IR SOLN
Status: DC | PRN
  Administered 2013-07-31: 6000 mL

## 2013-07-31 MED ORDER — FENTANYL CITRATE 0.05 MG/ML IJ SOLN
INTRAMUSCULAR | Status: DC | PRN
  Administered 2013-07-31: 50 ug via INTRAVENOUS

## 2013-07-31 MED ORDER — FENTANYL CITRATE 0.05 MG/ML IJ SOLN
INTRAMUSCULAR | Status: AC
  Filled 2013-07-31: qty 2

## 2013-07-31 MED ORDER — PHENYLEPHRINE HCL 10 MG/ML IJ SOLN
INTRAMUSCULAR | Status: DC | PRN
  Administered 2013-07-31 (×2): 80 ug via INTRAVENOUS

## 2013-07-31 MED ORDER — BUPIVACAINE HCL (PF) 0.5 % IJ SOLN
INTRAMUSCULAR | Status: DC | PRN
  Administered 2013-07-31: 20 mL

## 2013-07-31 MED ORDER — PROMETHAZINE HCL 25 MG/ML IJ SOLN: 6.2500 mg | INTRAMUSCULAR | Status: DC | PRN

## 2013-07-31 MED ORDER — MIDAZOLAM HCL 2 MG/2ML IJ SOLN
INTRAMUSCULAR | Status: AC
  Filled 2013-07-31: qty 2

## 2013-07-31 MED ORDER — OXYCODONE HCL 5 MG/5ML PO SOLN: 5.0000 mg | Freq: Once | ORAL | Status: DC | PRN

## 2013-07-31 MED ORDER — PROPOFOL 10 MG/ML IV BOLUS
INTRAVENOUS | Status: DC | PRN
  Administered 2013-07-31: 300 mg via INTRAVENOUS
  Administered 2013-07-31: 50 mg via INTRAVENOUS

## 2013-07-31 MED ORDER — OXYCODONE-ACETAMINOPHEN 5-325 MG PO TABS: 1.0000 | ORAL_TABLET | ORAL | Status: DC | PRN

## 2013-07-31 MED ORDER — PROPOFOL 10 MG/ML IV BOLUS
INTRAVENOUS | Status: AC
  Filled 2013-07-31: qty 40

## 2013-07-31 MED ORDER — DOCUSATE SODIUM 100 MG PO CAPS: 100.0000 mg | ORAL_CAPSULE | Freq: Three times a day (TID) | ORAL | Status: DC | PRN

## 2013-07-31 MED ORDER — BUPIVACAINE-EPINEPHRINE PF 0.5-1:200000 % IJ SOLN
INTRAMUSCULAR | Status: DC | PRN
  Administered 2013-07-31: 25 mL

## 2013-07-31 MED ORDER — OXYCODONE HCL 5 MG PO TABS: 5.0000 mg | ORAL_TABLET | Freq: Once | ORAL | Status: DC | PRN

## 2013-07-31 MED ORDER — DEXAMETHASONE SODIUM PHOSPHATE 4 MG/ML IJ SOLN
INTRAMUSCULAR | Status: DC | PRN
  Administered 2013-07-31: 10 mg via INTRAVENOUS

## 2013-07-31 MED ORDER — HYDROMORPHONE HCL PF 1 MG/ML IJ SOLN: 0.2500 mg | INTRAMUSCULAR | Status: DC | PRN

## 2013-07-31 MED ORDER — POVIDONE-IODINE 7.5 % EX SOLN: Freq: Once | CUTANEOUS | Status: DC

## 2013-07-31 MED ORDER — DEXTROSE 5 % IV SOLN
3.0000 g | INTRAVENOUS | Status: AC
  Administered 2013-07-31: 2 g via INTRAVENOUS

## 2013-07-31 MED ORDER — SUCCINYLCHOLINE CHLORIDE 20 MG/ML IJ SOLN
INTRAMUSCULAR | Status: DC | PRN
  Administered 2013-07-31: 120 mg via INTRAVENOUS

## 2013-07-31 MED ORDER — MIDAZOLAM HCL 2 MG/2ML IJ SOLN: 1.0000 mg | INTRAMUSCULAR | Status: DC | PRN

## 2013-07-31 MED ORDER — FENTANYL CITRATE 0.05 MG/ML IJ SOLN: 50.0000 ug | INTRAMUSCULAR | Status: DC | PRN

## 2013-07-31 MED ORDER — FENTANYL CITRATE 0.05 MG/ML IJ SOLN
50.0000 ug | Freq: Once | INTRAMUSCULAR | Status: AC
  Administered 2013-07-31: 100 ug via INTRAVENOUS

## 2013-07-31 MED ORDER — EPHEDRINE SULFATE 50 MG/ML IJ SOLN
INTRAMUSCULAR | Status: DC | PRN
  Administered 2013-07-31 (×4): 10 mg via INTRAVENOUS

## 2013-07-31 MED ORDER — ONDANSETRON HCL 4 MG/2ML IJ SOLN
INTRAMUSCULAR | Status: DC | PRN
  Administered 2013-07-31: 4 mg via INTRAVENOUS

## 2013-07-31 MED ORDER — LACTATED RINGERS IV SOLN
INTRAVENOUS | Status: DC
  Administered 2013-07-31 (×2): via INTRAVENOUS
  Administered 2013-07-31: 20 mL/h via INTRAVENOUS

## 2013-07-31 MED ORDER — LIDOCAINE HCL (CARDIAC) 20 MG/ML IV SOLN
INTRAVENOUS | Status: DC | PRN
  Administered 2013-07-31: 100 mg via INTRAVENOUS

## 2013-07-31 MED ORDER — BUPIVACAINE HCL (PF) 0.5 % IJ SOLN
INTRAMUSCULAR | Status: AC
  Filled 2013-07-31: qty 30

## 2013-07-31 SURGICAL SUPPLY — 83 items
ANCHOR PEEK 4.75X19.1 SWLK C (Anchor) ×2 IMPLANT
BENZOIN TINCTURE PRP APPL 2/3 (GAUZE/BANDAGES/DRESSINGS) IMPLANT
BLADE SURG 15 STRL LF DISP TIS (BLADE) ×1 IMPLANT
BLADE SURG 15 STRL SS (BLADE) ×1
BLADE SURG ROTATE 9660 (MISCELLANEOUS) IMPLANT
BLADE VORTEX 6.0 (BLADE) IMPLANT
BUR OVAL 4.0 (BURR) ×2 IMPLANT
CANISTER SUCT 3000ML (MISCELLANEOUS) IMPLANT
CANNULA 5.75X71 LONG (CANNULA) ×2 IMPLANT
CANNULA TWIST IN 8.25X7CM (CANNULA) ×2 IMPLANT
CHLORAPREP W/TINT 26ML (MISCELLANEOUS) ×2 IMPLANT
DECANTER SPIKE VIAL GLASS SM (MISCELLANEOUS) IMPLANT
DERMABOND ADVANCED (GAUZE/BANDAGES/DRESSINGS) ×1
DERMABOND ADVANCED .7 DNX12 (GAUZE/BANDAGES/DRESSINGS) ×1 IMPLANT
DRAPE INCISE IOBAN 66X45 STRL (DRAPES) ×2 IMPLANT
DRAPE STERI 35X30 U-POUCH (DRAPES) ×2 IMPLANT
DRAPE SURG 17X23 STRL (DRAPES) ×2 IMPLANT
DRAPE U 20/CS (DRAPES) ×2 IMPLANT
DRAPE U-SHAPE 47X51 STRL (DRAPES) ×2 IMPLANT
DRAPE U-SHAPE 76X120 STRL (DRAPES) ×4 IMPLANT
DRSG PAD ABDOMINAL 8X10 ST (GAUZE/BANDAGES/DRESSINGS) ×2 IMPLANT
ELECT BLADE 4.0 EZ CLEAN MEGAD (MISCELLANEOUS) ×2
ELECT REM PT RETURN 9FT ADLT (ELECTROSURGICAL) ×2
ELECTRODE BLDE 4.0 EZ CLN MEGD (MISCELLANEOUS) ×1 IMPLANT
ELECTRODE REM PT RTRN 9FT ADLT (ELECTROSURGICAL) ×1 IMPLANT
GAUZE SPONGE 4X4 16PLY XRAY LF (GAUZE/BANDAGES/DRESSINGS) IMPLANT
GAUZE XEROFORM 1X8 LF (GAUZE/BANDAGES/DRESSINGS) ×2 IMPLANT
GLOVE BIO SURGEON STRL SZ7 (GLOVE) ×2 IMPLANT
GLOVE BIO SURGEON STRL SZ7.5 (GLOVE) ×2 IMPLANT
GLOVE BIOGEL PI IND STRL 7.0 (GLOVE) ×2 IMPLANT
GLOVE BIOGEL PI IND STRL 8 (GLOVE) ×1 IMPLANT
GLOVE BIOGEL PI INDICATOR 7.0 (GLOVE) ×2
GLOVE BIOGEL PI INDICATOR 8 (GLOVE) ×1
GLOVE ECLIPSE 6.5 STRL STRAW (GLOVE) ×2 IMPLANT
GOWN STRL REUS W/ TWL LRG LVL3 (GOWN DISPOSABLE) ×2 IMPLANT
GOWN STRL REUS W/TWL LRG LVL3 (GOWN DISPOSABLE) ×2
GOWN STRL REUS W/TWL XL LVL3 (GOWN DISPOSABLE) ×2 IMPLANT
GOWN STRL REUS W/TWL XL LVL4 (GOWN DISPOSABLE) IMPLANT
LASSO CRESCENT QUICKPASS (SUTURE) ×2 IMPLANT
MANIFOLD NEPTUNE II (INSTRUMENTS) ×2 IMPLANT
NDL SUT 6 .5 CRC .975X.05 MAYO (NEEDLE) IMPLANT
NEEDLE 1/2 CIR CATGUT .05X1.09 (NEEDLE) IMPLANT
NEEDLE MAYO TAPER (NEEDLE)
NEEDLE SCORPION MULTI FIRE (NEEDLE) ×2 IMPLANT
NS IRRIG 1000ML POUR BTL (IV SOLUTION) IMPLANT
PACK ARTHROSCOPY DSU (CUSTOM PROCEDURE TRAY) ×2 IMPLANT
PACK BASIN DAY SURGERY FS (CUSTOM PROCEDURE TRAY) ×2 IMPLANT
PENCIL BUTTON HOLSTER BLD 10FT (ELECTRODE) IMPLANT
RESECTOR FULL RADIUS 4.2MM (BLADE) ×2 IMPLANT
SLEEVE SCD COMPRESS KNEE MED (MISCELLANEOUS) ×2 IMPLANT
SLING ARM IMMOBILIZER MED (SOFTGOODS) IMPLANT
SLING ARM LRG ADULT FOAM STRAP (SOFTGOODS) IMPLANT
SLING ARM MED ADULT FOAM STRAP (SOFTGOODS) IMPLANT
SLING ARM XL FOAM STRAP (SOFTGOODS) IMPLANT
SPONGE GAUZE 4X4 12PLY (GAUZE/BANDAGES/DRESSINGS) ×2 IMPLANT
SPONGE LAP 4X18 X RAY DECT (DISPOSABLE) ×2 IMPLANT
STRIP CLOSURE SKIN 1/2X4 (GAUZE/BANDAGES/DRESSINGS) IMPLANT
SUCTION FRAZIER TIP 10 FR DISP (SUCTIONS) IMPLANT
SUPPORT WRAP ARM LG (MISCELLANEOUS) ×2 IMPLANT
SUT 2 FIBERLOOP 20 STRT BLUE (SUTURE) ×2
SUT BONE WAX W31G (SUTURE) IMPLANT
SUT ETHIBOND 2 OS 4 DA (SUTURE) IMPLANT
SUT ETHILON 3 0 PS 1 (SUTURE) ×2 IMPLANT
SUT ETHILON 4 0 PS 2 18 (SUTURE) IMPLANT
SUT FIBERWIRE #2 38 T-5 BLUE (SUTURE)
SUT MNCRL AB 3-0 PS2 18 (SUTURE) IMPLANT
SUT MNCRL AB 4-0 PS2 18 (SUTURE) IMPLANT
SUT PDS AB 0 CT 36 (SUTURE) ×2 IMPLANT
SUT PROLENE 3 0 PS 2 (SUTURE) IMPLANT
SUT VIC AB 0 CT1 27 (SUTURE)
SUT VIC AB 0 CT1 27XBRD ANBCTR (SUTURE) IMPLANT
SUT VIC AB 2-0 SH 27 (SUTURE) ×1
SUT VIC AB 2-0 SH 27XBRD (SUTURE) ×1 IMPLANT
SUTURE 2 FIBERLOOP 20 STRT BLU (SUTURE) ×1 IMPLANT
SUTURE FIBERWR #2 38 T-5 BLUE (SUTURE) IMPLANT
SYR BULB 3OZ (MISCELLANEOUS) IMPLANT
TOWEL OR 17X24 6PK STRL BLUE (TOWEL DISPOSABLE) ×2 IMPLANT
TOWEL OR NON WOVEN STRL DISP B (DISPOSABLE) IMPLANT
TUBE CONNECTING 20X1/4 (TUBING) ×2 IMPLANT
TUBING ARTHROSCOPY IRRIG 16FT (MISCELLANEOUS) ×2 IMPLANT
WAND STAR VAC 90 (SURGICAL WAND) ×2 IMPLANT
WATER STERILE IRR 1000ML POUR (IV SOLUTION) ×2 IMPLANT
YANKAUER SUCT BULB TIP NO VENT (SUCTIONS) ×2 IMPLANT

## 2013-07-31 NOTE — Progress Notes (Signed)
Assisted Dr. Chriss Driver with left, ultrasound guided, interscalene block. Side rails up, monitors on throughout procedure. See vital signs in flow sheet. Tolerated Procedure well.

## 2013-07-31 NOTE — H&P (Signed)
Devin Mcdonald is an 58 y.o. male.   Chief Complaint: L shoulder pain and stiffness HPI: s/p L shoulder surgery with persistent pain and stiffness.  Past Medical History  Diagnosis Date  . No pertinent past medical history   . Wears glasses   . History of renal insufficiency syndrome   . Gout   . Dental bridge present   . Arthritis     Past Surgical History  Procedure Laterality Date  . Arthroplasty      left knee 25 years ago  . Colonoscopy    . Tonsillectomy    . Shoulder arthroscopy with rotator cuff repair Left 02/20/2013    Procedure: SHOULDER ARTHROSCOPY WITH ARTHROSCOPIC  ROTATOR CUFF REPAIR, SUBACROMIAL DECOMPRESSION, DEBRIDEMENT OF ARTHRITIS;  Surgeon: Nita Sells, MD;  Location: [place];  Service: Orthopedics;  Laterality: Left;    History reviewed. No pertinent family history. Social History:  reports that he has never smoked. He has never used smokeless tobacco. He reports that he does not drink alcohol or use illicit drugs.  Allergies: No Known Allergies  Medications Prior to Admission  Medication Sig Dispense Refill  . allopurinol (ZYLOPRIM) 300 MG tablet Take 300 mg by mouth daily.      Marland Kitchen oxyCODONE-acetaminophen (ROXICET) 5-325 MG per tablet Take 1-2 tablets by mouth every 4 (four) hours as needed for pain.  60 tablet  0    Results for orders placed during the hospital encounter of 07/31/13 (from the past 48 hour(s))  POCT HEMOGLOBIN-HEMACUE     Status: None   Collection Time    07/31/13 10:24 AM      Result Value Ref Range   Hemoglobin 14.5  13.0 - 17.0 g/dL   No results found.  Review of Systems  All other systems reviewed and are negative.    Blood pressure 119/72, pulse 87, temperature 98.3 F (36.8 C), temperature source Oral, resp. rate 11, height 6' (1.829 m), weight 127.914 kg (282 lb), SpO2 98.00%. Physical Exam  Constitutional: He is oriented to person, place, and time. He appears well-developed and  well-nourished.  HENT:  Head: Atraumatic.  Eyes: EOM are normal.  Cardiovascular: Intact distal pulses.   Respiratory: Effort normal.  Musculoskeletal:  L shoulder pain with limited motion.  TTP ant/sup  Neurological: He is alert and oriented to person, place, and time.  Skin: Skin is warm and dry.  Psychiatric: He has a normal mood and affect.     Assessment/Plan L shoulder pain and stiffness after surgery with c/o RCT, biceps pathology Plan EUA, dx arthroscopy, possible RCR, biceps tenodesis Risks / benefits of surgery discussed Consent on chart  NPO for OR Preop antibiotics   Devin Mcdonald 07/31/2013, 11:26 AM

## 2013-07-31 NOTE — Anesthesia Postprocedure Evaluation (Signed)
  Anesthesia Post-op Note  Patient: Devin Mcdonald  Procedure(s) Performed: Procedure(s) with comments: Left shoulder diagnostic arthroscopy with possible manipulation, rotator cuff repair, biceps tenodesis. (Left) - Left shoulder diagnostic arthroscopy with possible manipulation, rotator cuff repair, biceps tenodesis.  Patient Location: PACU  Anesthesia Type:GA combined with regional for post-op pain  Level of Consciousness: awake, alert  and oriented  Airway and Oxygen Therapy: Patient Spontanous Breathing and Patient connected to face mask oxygen  Post-op Pain: none  Post-op Assessment: Post-op Vital signs reviewed  Post-op Vital Signs: Reviewed  Complications: No apparent anesthesia complications

## 2013-07-31 NOTE — Discharge Instructions (Signed)
Discharge Instructions after Arthroscopic Shoulder Repair ° ° °A sling has been provided for you. Remain in your sling at all times. This includes sleeping in your sling.  °Use ice on the shoulder intermittently over the first 48 hours after surgery.  °Pain medicine has been prescribed for you.  °Use your medicine liberally over the first 48 hours, and then you can begin to taper your use. You may take Extra Strength Tylenol or Tylenol only in place of the pain pills. DO NOT take ANY nonsteroidal anti-inflammatory pain medications: Advil, Motrin, Ibuprofen, Aleve, Naproxen, or Narprosyn.  °You may remove your dressing after two days. If the incision sites are still moist, place a Band-Aid over the moist site(s). Change Band-Aids daily until dry.  °You may shower 5 days after surgery. The incisions CANNOT get wet prior to 5 days. Simply allow the water to wash over the site and then pat dry. Do not rub the incisions. Make sure your axilla (armpit) is completely dry after showering.  °Take one aspirin a day for 2 weeks after surgery, unless you have an aspirin sensitivity/ allergy or asthma. ° ° °Please call [phone] during normal business hours or [phone] after hours for any problems. Including the following: ° °- excessive redness of the incisions °- drainage for more than 4 days °- fever of more than 101.5 F ° °*Please note that pain medications will not be refilled after hours or on weekends. ° ° ° °Post Anesthesia Home Care Instructions ° °Activity: °Get plenty of rest for the remainder of the day. A responsible adult should stay with you for 24 hours following the procedure.  °For the next 24 hours, DO NOT: °-Drive a car °-Operate machinery °-Drink alcoholic beverages °-Take any medication unless instructed by your physician °-Make any legal decisions or sign important papers. ° °Meals: °Start with liquid foods such as gelatin or soup. Progress to regular foods as tolerated. Avoid greasy, spicy, heavy  foods. If nausea and/or vomiting occur, drink only clear liquids until the nausea and/or vomiting subsides. Call your physician if vomiting continues. ° °Special Instructions/Symptoms: °Your throat may feel dry or sore from the anesthesia or the breathing tube placed in your throat during surgery. If this causes discomfort, gargle with warm salt water. The discomfort should disappear within 24 hours. ° ° °Regional Anesthesia Blocks ° °1. Numbness or the inability to move the "blocked" extremity may last from 3-48 hours after placement. The length of time depends on the medication injected and your individual response to the medication. If the numbness is not going away after 48 hours, call your surgeon. ° °2. The extremity that is blocked will need to be protected until the numbness is gone and the  Strength has returned. Because you cannot feel it, you will need to take extra care to avoid injury. Because it may be weak, you may have difficulty moving it or using it. You may not know what position it is in without looking at it while the block is in effect. ° °3. For blocks in the legs and feet, returning to weight bearing and walking needs to be done carefully. You will need to wait until the numbness is entirely gone and the strength has returned. You should be able to move your leg and foot normally before you try and bear weight or walk. You will need someone to be with you when you first try to ensure you do not fall and possibly risk injury. ° °4. Bruising and tenderness   at the needle site are common side effects and will resolve in a few days. ° °5. Persistent numbness or new problems with movement should be communicated to the surgeon or the [facility] ([phone])/ [facility] ([phone]). °

## 2013-07-31 NOTE — Anesthesia Preprocedure Evaluation (Addendum)
Anesthesia Evaluation  Patient identified by MRN, date of birth, ID band Patient awake    Reviewed: Allergy & Precautions, H&P , NPO status , Patient's Chart, lab work & pertinent test results  Airway Mallampati: II TM Distance: >3 FB Neck ROM: Full    Dental  (+) Caps, Implants   Pulmonary  + rhonchi         Cardiovascular Rhythm:Regular Rate:Normal     Neuro/Psych    GI/Hepatic   Endo/Other    Renal/GU Renal disease     Musculoskeletal   Abdominal (+) + obese,   Peds  Hematology   Anesthesia Other Findings Cough, mild rhonchi Feels like head congested from allergies  Reproductive/Obstetrics                          Anesthesia Physical Anesthesia Plan  ASA: II  Anesthesia Plan: General   Post-op Pain Management:    Induction: Intravenous  Airway Management Planned: Oral ETT  Additional Equipment:   Intra-op Plan:   Post-operative Plan: Extubation in OR  Informed Consent: I have reviewed the patients History and Physical, chart, labs and discussed the procedure including the risks, benefits and alternatives for the proposed anesthesia with the patient or authorized representative who has indicated his/her understanding and acceptance.     Plan Discussed with: CRNA and Surgeon  Anesthesia Plan Comments:         Anesthesia Quick Evaluation

## 2013-07-31 NOTE — Transfer of Care (Signed)
Immediate Anesthesia Transfer of Care Note  Patient: Devin Mcdonald  Procedure(s) Performed: Procedure(s) with comments: Left shoulder diagnostic arthroscopy with possible manipulation, rotator cuff repair, biceps tenodesis. (Left) - Left shoulder diagnostic arthroscopy with possible manipulation, rotator cuff repair, biceps tenodesis.  Patient Location: PACU  Anesthesia Type:GA combined with regional for post-op pain  Level of Consciousness: sedated  Airway & Oxygen Therapy: Patient Spontanous Breathing and Patient connected to face mask oxygen  Post-op Assessment: Report given to PACU RN and Post -op Vital signs reviewed and stable  Post vital signs: Reviewed and stable  Complications: No apparent anesthesia complications

## 2013-07-31 NOTE — Anesthesia Procedure Notes (Addendum)
Anesthesia Regional Block:  Interscalene brachial plexus block  Pre-Anesthetic Checklist: ,, timeout performed, Correct Patient, Correct Site, Correct Laterality, Correct Procedure, Correct Position, site marked, Risks and benefits discussed,  Surgical consent,  Pre-op evaluation,  At surgeon's request and post-op pain management  Laterality: Left  Prep: chloraprep       Needles:  Injection technique: Single-shot  Needle Type: Echogenic Stimulator Needle     Needle Length: 5cm 5 cm Needle Gauge: 22 and 22 G    Additional Needles:  Procedures: ultrasound guided (picture in chart) and nerve stimulator Interscalene brachial plexus block  Nerve Stimulator or Paresthesia:  Response: 0.5 mA,   Additional Responses:   Narrative:  Start time: 07/31/2013 10:16 AM End time: 07/31/2013 10:28 AM Injection made incrementally with aspirations every 5 mL. Anesthesiologist: Dr Chriss Driver  Additional Notes: [id number] L ISB POP CHG prep, sterile tech #22 stim/echo needle with stim down to .18ma and good [place] visualization-PIX in chart Multiple neg asp Altamese Dilling .5% w/epi 1:200000 total 25cc+decadron 4mg  infiltrated No compl Dr Chriss Driver   Procedure Name: Intubation Date/Time: 07/31/2013 11:43 AM Performed by: Maryella Shivers Pre-anesthesia Checklist: Patient identified, Emergency Drugs available, Suction available and Patient being monitored Patient Re-evaluated:Patient Re-evaluated prior to inductionOxygen Delivery Method: Circle System Utilized Preoxygenation: Pre-oxygenation with 100% oxygen Intubation Type: IV induction Ventilation: Mask ventilation without difficulty Laryngoscope Size: Mac and 3 Grade View: Grade I Tube type: Oral Number of attempts: 1 Airway Equipment and Method: stylet and oral airway Placement Confirmation: ETT inserted through vocal cords under direct vision,  positive ETCO2 and breath sounds checked- equal and bilateral Secured at: 23 cm Tube secured with:  Tape Dental Injury: Teeth and Oropharynx as per pre-operative assessment

## 2013-07-31 NOTE — Op Note (Signed)
Procedure(s): Left shoulder diagnostic arthroscopy with possible manipulation, rotator cuff repair, biceps tenodesis. Procedure Note  Devin Mcdonald male 58 y.o. 07/31/2013  Procedure(s) and Anesthesia Type:    #1 left shoulder diagnostic arthroscopy    #2 left shoulder debridement glenohumeral osteoarthritis with degenerative labral tears, and biceps tenotomy    #3 left shoulder takedown and repair high-grade partial-thickness rotator cuff tear    #4 left shoulder subpectoral biceps tenodesis  Surgeon(s) and Role:    * Nita Sells, MD - Primary     Surgeon: Nita Sells   Assistants: Jeanmarie Hubert PA-C [facility] was present and scrubbed throughout the procedure and was essential in positioning, assisting with the camera and instrumentation,, and closure)  Anesthesia: General endotracheal anesthesia with preoperative interscalene block    Procedure Detail  Left shoulder diagnostic arthroscopy with possible manipulation, rotator cuff repair, biceps tenodesis.  Estimated Blood Loss: Min         Drains: none  Blood Given: none         Specimens: none        Complications:  * No complications entered in OR log *         Disposition: PACU - hemodynamically stable.         Condition: stable    Procedure:   INDICATIONS FOR SURGERY: The patient is 58 y.o. male who underwent left shoulder subscapularis repair in October of 2014. He had a significant amount of pain postoperatively and was unable to move forward in his rehabilitation. He was concerned that he had stiffness. He had a repeat MRI which showed the subscapularis repair to be intact. There is an area of abnormal high-grade partial tearing of the supraspinatus anteriorly adjacent to the biceps tendon. 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 He had a trial of  injection therapy and failed physical therapy and ultimately wished to go forward with surgery to try and decrease his pain and increase his function.  OPERATIVE FINDINGS: Examination under anesthesia: He had no stiffness or instability. Diagnostic Arthroscopy:  Glenoid articular cartilage: He had some geographic grade 3 and grade 4 changes. Several unstable chondral flaps debrided Humeral head articular cartilage: Posterior one third of the humeral head had grade 3 and 4 chondromalacia. Anteriorly cartilage is mostly intact.  Labrum:He did have some degenerative tears in the posterior anterior labrum which were debrided back to healthy labral tissue. The long head of the biceps tendon had severe tenosynovitis in the groove and therefore a biceps tenotomy and open subpectoral tenodesis was performed. Loose bodies: None Synovitis: Moderate Articular sided rotator cuff: There were a few adhesions to the superior capsule. There was an area in the central supraspinatus which is noted to be thinned with delaminated undersurface retraction. Ultimately this area was taken down and repaired with a fiber tape.   DESCRIPTION OF PROCEDURE: The patient was identified in preoperative  holding area where I personally marked the operative site after  verifying site, side, and procedure with the patient. An interscalene block was given by the attending anesthesiologist the holding area.  The patient was taken back to the operating room where general anesthesia was induced without complication and was placed in the beach-chair position with the back  elevated about 60 degrees and all extremities and head and neck carefully padded and  positioned.   The left upper extremity was then prepped and  draped  in a standard sterile fashion. The appropriate time-out  procedure was carried out. The patient did receive IV antibiotics  within 30 minutes of incision.   A small posterior portal incision was made and the  arthroscope was introduced into the joint. An anterior portal was then established above the subscapularis using needle localization. Small cannula was placed anteriorly. Diagnostic arthroscopy was then carried out with findings as described above.  The subscapularis repair was noted to be completely healed and intact and looked anatomic. One of the sutures was visible but not prominent. The glenohumeral joint surfaces were carefully examined. He was noted to have some geographic grade 3 and 4 changes on the glenoid side. Several unstable chondral flaps were debrided to a smooth surface. The humeral head had some grade 3 and 4 changes in the posterior one third. Anteriorly the cartilage was largely intact. There were some degenerative tears of the posterior and anterior labrum which were debrided back to healthy labrum. No loose bodies were noted. There was some synovitis on the undersurface of the rotator cuff and some adhesions to the superior capsule. This was debrided with shaver and ArthriCare. The biceps tendon was pulled into the joint and noted to have severe tenosynovitis in the area of the bicipital groove. Therefore a biceps tenotomy with a large biter was performed for later subpectoral tenodesis.  The articular surface the rotator cuff was carefully examined. It was intact posteriorly. In the area of concern posterior to the biceps tendon there was noted to be thinning of the tendon but no significant exposed tuberosity. There was delamination of the tendon in this area over a distance of about 1 cm. This was lightly debrided from the undersurface. A spinal needle was passed percutaneously and a 0 PDS suture was passed through the area which was in delaminated for identification from the bursal surface.  The arthroscope was then introduced into the subacromial space a standard lateral portal was established with needle localization. The shaver was used through the lateral portal to perform  extensive bursectomy. The bursal surface of the rotator cuff was carefully examined.  The cuff was intact. Anteriorly where the PDS suture was identified the shaver was used to probe the tissue is noted to be very thin and soft tear. The blunt tip of the shaver actually could be pushed through this tissue. Therefore I felt that this high grade partial tear is likely cause of his continued pain in this area and the remaining tendon was taken down here to complete the tear. A large cannula was placed. The shaver was used to debride the tear edge and the surface of the tuberosity to promote healing. A fiber tape was then passed in an inverted mattress configuration using a scorpion suture passer. Each strand from the fiber tape was placed into a 4.75 peak swivel lock anchor which was then placed into the central aspect of the tear on the tuberosity. This reduced the tear nicely. The repair was felt to be stable. The arthroscope was then removed from the joint.   Attention was then turned to the axilla where a approximately 3 cm incision was made in the dominant axillary fold. This was about 50% above and 50% below the palpable lower border of the pectoralis major. Dissection was carried out between the lower border of the pectoralis major and the short head of the biceps muscle belly. The anterior humerus was then exposed and the long head biceps was delivered out through the wound. The biceps  was prepared using a #2 FiberWire fiber loop and the remaining portion of the biceps tendon was discarded after choosing the appropriate tension and length. A drill bit slightly smaller than the tendon was used in the distal bicipital groove to create an intramedullary hole and then a drill bit about 12 mm distal to that was used which was slightly larger than the suture passer needle. A crescent suture lasso was then used to pass the sutures from proximal to distal and then one suture was brought around medial and lateral to  the tendon. It was tensioned, dunking the tendon into the intramedullary canal and tied over the anterior portion of the tendon. The tension was felt to be appropriate. The wound was copiously irrigated with normal saline and subsequently closed in layers with 2-0 Vicryl in the deep dermal layer and Dermabond for skin closure. This area was infiltrated with about 10 cc of half percent Marcaine without epinephrine.  The portals were closed with 3-0 nylon in an interrupted fashion. Sterile dressings were then applied including Xeroform 4 x 4's ABDs and tape. The patient was then allowed to awaken from general anesthesia, placed in a sling, transferred to the stretcher and taken to the recovery room in stable condition.   POSTOPERATIVE PLAN: The patient will be discharged home today and will followup in one week for suture removal and wound check.

## 2013-08-01 ENCOUNTER — Encounter (HOSPITAL_BASED_OUTPATIENT_CLINIC_OR_DEPARTMENT_OTHER): Payer: Self-pay | Admitting: Orthopedic Surgery

## 2014-11-20 ENCOUNTER — Other Ambulatory Visit: Payer: Self-pay | Admitting: Orthopedic Surgery

## 2014-12-04 ENCOUNTER — Encounter (HOSPITAL_BASED_OUTPATIENT_CLINIC_OR_DEPARTMENT_OTHER): Payer: Self-pay | Admitting: *Deleted

## 2014-12-10 ENCOUNTER — Encounter (HOSPITAL_BASED_OUTPATIENT_CLINIC_OR_DEPARTMENT_OTHER)
Admission: RE | Disposition: A | Discharge status: Home / Self Care | Payer: Self-pay | Source: Ambulatory Visit | Attending: Orthopedic Surgery

## 2014-12-10 ENCOUNTER — Encounter (HOSPITAL_BASED_OUTPATIENT_CLINIC_OR_DEPARTMENT_OTHER): Payer: Self-pay | Admitting: Anesthesiology

## 2014-12-10 ENCOUNTER — Ambulatory Visit (HOSPITAL_BASED_OUTPATIENT_CLINIC_OR_DEPARTMENT_OTHER): Payer: Worker's Compensation | Admitting: Anesthesiology

## 2014-12-10 ENCOUNTER — Ambulatory Visit (HOSPITAL_BASED_OUTPATIENT_CLINIC_OR_DEPARTMENT_OTHER)
Admission: RE | Admit: 2014-12-10 | Discharge: 2014-12-10 | Disposition: A | Discharge status: Home / Self Care | Payer: Worker's Compensation | Source: Ambulatory Visit | Attending: Orthopedic Surgery | Admitting: Orthopedic Surgery

## 2014-12-10 DIAGNOSIS — M1990 Unspecified osteoarthritis, unspecified site: Secondary | ICD-10-CM | POA: Diagnosis not present

## 2014-12-10 DIAGNOSIS — M7502 Adhesive capsulitis of left shoulder: Secondary | ICD-10-CM | POA: Diagnosis not present

## 2014-12-10 DIAGNOSIS — M109 Gout, unspecified: Secondary | ICD-10-CM | POA: Diagnosis not present

## 2014-12-10 DIAGNOSIS — M25512 Pain in left shoulder: Secondary | ICD-10-CM | POA: Diagnosis present

## 2014-12-10 DIAGNOSIS — M94212 Chondromalacia, left shoulder: Secondary | ICD-10-CM | POA: Insufficient documentation

## 2014-12-10 HISTORY — PX: LYSIS OF ADHESION: SHX5961

## 2014-12-10 HISTORY — PX: SHOULDER ARTHROSCOPY: SHX128

## 2014-12-10 HISTORY — PX: SHOULDER CLOSED REDUCTION: SHX1051

## 2014-12-10 LAB — POCT HEMOGLOBIN-HEMACUE: Hemoglobin: 14.6 g/dL (ref 13.0–17.0)

## 2014-12-10 SURGERY — ARTHROSCOPY, SHOULDER
Anesthesia: General | Site: Shoulder | Laterality: Left

## 2014-12-10 MED ORDER — BUPIVACAINE-EPINEPHRINE (PF) 0.5% -1:200000 IJ SOLN
INTRAMUSCULAR | Status: DC | PRN
  Administered 2014-12-10: 30 mL via PERINEURAL

## 2014-12-10 MED ORDER — PROPOFOL 10 MG/ML IV BOLUS
INTRAVENOUS | Status: DC | PRN
  Administered 2014-12-10: 200 mg via INTRAVENOUS

## 2014-12-10 MED ORDER — DEXAMETHASONE SODIUM PHOSPHATE 4 MG/ML IJ SOLN
INTRAMUSCULAR | Status: DC | PRN
  Administered 2014-12-10: 10 mg via INTRAVENOUS

## 2014-12-10 MED ORDER — LIDOCAINE HCL (CARDIAC) 20 MG/ML IV SOLN
INTRAVENOUS | Status: DC | PRN
  Administered 2014-12-10: 50 mg via INTRAVENOUS

## 2014-12-10 MED ORDER — FENTANYL CITRATE (PF) 100 MCG/2ML IJ SOLN
INTRAMUSCULAR | Status: AC
  Filled 2014-12-10: qty 4

## 2014-12-10 MED ORDER — SCOPOLAMINE 1 MG/3DAYS TD PT72: 1.0000 | MEDICATED_PATCH | Freq: Once | TRANSDERMAL | Status: DC | PRN

## 2014-12-10 MED ORDER — GLYCOPYRROLATE 0.2 MG/ML IJ SOLN: 0.2000 mg | Freq: Once | INTRAMUSCULAR | Status: DC | PRN

## 2014-12-10 MED ORDER — LACTATED RINGERS IV SOLN
INTRAVENOUS | Status: DC
Start: 2014-12-10 — End: 2014-12-10
  Administered 2014-12-10 (×2): via INTRAVENOUS

## 2014-12-10 MED ORDER — FENTANYL CITRATE (PF) 100 MCG/2ML IJ SOLN
50.0000 ug | INTRAMUSCULAR | Status: DC | PRN
  Administered 2014-12-10: 100 ug via INTRAVENOUS

## 2014-12-10 MED ORDER — ONDANSETRON HCL 4 MG/2ML IJ SOLN: 4.0000 mg | Freq: Once | INTRAMUSCULAR | Status: DC | PRN

## 2014-12-10 MED ORDER — OXYCODONE-ACETAMINOPHEN 5-325 MG PO TABS: 1.0000 | ORAL_TABLET | ORAL | Status: AC | PRN

## 2014-12-10 MED ORDER — MIDAZOLAM HCL 2 MG/2ML IJ SOLN
1.0000 mg | INTRAMUSCULAR | Status: DC | PRN
  Administered 2014-12-10: 2 mg via INTRAVENOUS

## 2014-12-10 MED ORDER — SUCCINYLCHOLINE CHLORIDE 20 MG/ML IJ SOLN
INTRAMUSCULAR | Status: DC | PRN
  Administered 2014-12-10: 100 mg via INTRAVENOUS

## 2014-12-10 MED ORDER — CEFAZOLIN SODIUM-DEXTROSE 2-3 GM-% IV SOLR
2.0000 g | INTRAVENOUS | Status: AC
  Administered 2014-12-10: 2 g via INTRAVENOUS

## 2014-12-10 MED ORDER — SODIUM CHLORIDE 0.9 % IR SOLN
Status: DC | PRN
  Administered 2014-12-10: 6000 mL

## 2014-12-10 MED ORDER — HYDROMORPHONE HCL 1 MG/ML IJ SOLN: 0.2500 mg | INTRAMUSCULAR | Status: DC | PRN

## 2014-12-10 MED ORDER — DOCUSATE SODIUM 100 MG PO CAPS: 100.0000 mg | ORAL_CAPSULE | Freq: Three times a day (TID) | ORAL | Status: AC | PRN

## 2014-12-10 MED ORDER — MIDAZOLAM HCL 2 MG/2ML IJ SOLN
INTRAMUSCULAR | Status: AC
  Filled 2014-12-10: qty 2

## 2014-12-10 MED ORDER — CEFAZOLIN SODIUM-DEXTROSE 2-3 GM-% IV SOLR
INTRAVENOUS | Status: AC
  Filled 2014-12-10: qty 50

## 2014-12-10 MED ORDER — POVIDONE-IODINE 7.5 % EX SOLN: Freq: Once | CUTANEOUS | Status: DC

## 2014-12-10 MED ORDER — MEPERIDINE HCL 25 MG/ML IJ SOLN: 6.2500 mg | INTRAMUSCULAR | Status: DC | PRN

## 2014-12-10 MED ORDER — FENTANYL CITRATE (PF) 100 MCG/2ML IJ SOLN
INTRAMUSCULAR | Status: AC
  Filled 2014-12-10: qty 2

## 2014-12-10 SURGICAL SUPPLY — 75 items
BENZOIN TINCTURE PRP APPL 2/3 (GAUZE/BANDAGES/DRESSINGS) IMPLANT
BLADE CLIPPER SURG (BLADE) IMPLANT
BLADE SURG 15 STRL LF DISP TIS (BLADE) IMPLANT
BLADE SURG 15 STRL SS (BLADE)
BUR OVAL 4.0 (BURR) ×3 IMPLANT
CANNULA 5.75X71 LONG (CANNULA) ×3 IMPLANT
CANNULA TWIST IN 8.25X7CM (CANNULA) IMPLANT
CHLORAPREP W/TINT 26ML (MISCELLANEOUS) ×3 IMPLANT
DECANTER SPIKE VIAL GLASS SM (MISCELLANEOUS) IMPLANT
DRAPE INCISE IOBAN 66X45 STRL (DRAPES) ×3 IMPLANT
DRAPE STERI 35X30 U-POUCH (DRAPES) ×3 IMPLANT
DRAPE SURG 17X23 STRL (DRAPES) ×3 IMPLANT
DRAPE U 20/CS (DRAPES) ×3 IMPLANT
DRAPE U-SHAPE 47X51 STRL (DRAPES) ×3 IMPLANT
DRAPE U-SHAPE 76X120 STRL (DRAPES) ×6 IMPLANT
DRSG PAD ABDOMINAL 8X10 ST (GAUZE/BANDAGES/DRESSINGS) ×3 IMPLANT
ELECT REM PT RETURN 9FT ADLT (ELECTROSURGICAL) ×3
ELECTRODE REM PT RTRN 9FT ADLT (ELECTROSURGICAL) ×2 IMPLANT
GAUZE SPONGE 4X4 12PLY STRL (GAUZE/BANDAGES/DRESSINGS) ×3 IMPLANT
GAUZE SPONGE 4X4 16PLY XRAY LF (GAUZE/BANDAGES/DRESSINGS) IMPLANT
GAUZE XEROFORM 1X8 LF (GAUZE/BANDAGES/DRESSINGS) ×3 IMPLANT
GLOVE BIO SURGEON STRL SZ7 (GLOVE) ×3 IMPLANT
GLOVE BIO SURGEON STRL SZ7.5 (GLOVE) ×3 IMPLANT
GLOVE BIOGEL PI IND STRL 7.0 (GLOVE) ×2 IMPLANT
GLOVE BIOGEL PI IND STRL 8 (GLOVE) ×2 IMPLANT
GLOVE BIOGEL PI INDICATOR 7.0 (GLOVE) ×1
GLOVE BIOGEL PI INDICATOR 8 (GLOVE) ×1
GLOVE SURG SS PI 7.0 STRL IVOR (GLOVE) ×3 IMPLANT
GOWN STRL REUS W/ TWL LRG LVL3 (GOWN DISPOSABLE) ×4 IMPLANT
GOWN STRL REUS W/ TWL XL LVL3 (GOWN DISPOSABLE) ×2 IMPLANT
GOWN STRL REUS W/TWL LRG LVL3 (GOWN DISPOSABLE) ×2
GOWN STRL REUS W/TWL XL LVL3 (GOWN DISPOSABLE) ×1
LASSO CRESCENT QUICKPASS (SUTURE) IMPLANT
LIQUID BAND (GAUZE/BANDAGES/DRESSINGS) IMPLANT
MANIFOLD NEPTUNE II (INSTRUMENTS) ×3 IMPLANT
NDL SUT 6 .5 CRC .975X.05 MAYO (NEEDLE) IMPLANT
NEEDLE 1/2 CIR CATGUT .05X1.09 (NEEDLE) IMPLANT
NEEDLE MAYO TAPER (NEEDLE)
NEEDLE SCORPION MULTI FIRE (NEEDLE) IMPLANT
NS IRRIG 1000ML POUR BTL (IV SOLUTION) IMPLANT
PACK ARTHROSCOPY DSU (CUSTOM PROCEDURE TRAY) ×3 IMPLANT
PACK BASIN DAY SURGERY FS (CUSTOM PROCEDURE TRAY) ×3 IMPLANT
PENCIL BUTTON HOLSTER BLD 10FT (ELECTRODE) IMPLANT
RESECTOR FULL RADIUS 4.2MM (BLADE) ×3 IMPLANT
SLEEVE SCD COMPRESS KNEE MED (MISCELLANEOUS) ×3 IMPLANT
SLING ARM IMMOBILIZER MED (SOFTGOODS) IMPLANT
SLING ARM LRG ADULT FOAM STRAP (SOFTGOODS) ×3 IMPLANT
SLING ARM MED ADULT FOAM STRAP (SOFTGOODS) IMPLANT
SLING ARM XL FOAM STRAP (SOFTGOODS) IMPLANT
SPONGE LAP 4X18 X RAY DECT (DISPOSABLE) IMPLANT
STRIP CLOSURE SKIN 1/2X4 (GAUZE/BANDAGES/DRESSINGS) IMPLANT
SUCTION FRAZIER TIP 10 FR DISP (SUCTIONS) IMPLANT
SUPPORT WRAP ARM LG (MISCELLANEOUS) ×3 IMPLANT
SUT BONE WAX W31G (SUTURE) IMPLANT
SUT ETHILON 3 0 PS 1 (SUTURE) ×3 IMPLANT
SUT FIBERWIRE #2 38 T-5 BLUE (SUTURE)
SUT MNCRL AB 3-0 PS2 18 (SUTURE) IMPLANT
SUT MNCRL AB 4-0 PS2 18 (SUTURE) IMPLANT
SUT PDS AB 0 CT 36 (SUTURE) IMPLANT
SUT PROLENE 3 0 PS 2 (SUTURE) IMPLANT
SUT TIGER TAPE 7 IN WHITE (SUTURE) IMPLANT
SUT VIC AB 0 CT1 27 (SUTURE)
SUT VIC AB 0 CT1 27XBRD ANBCTR (SUTURE) IMPLANT
SUT VIC AB 2-0 SH 27 (SUTURE)
SUT VIC AB 2-0 SH 27XBRD (SUTURE) IMPLANT
SUTURE FIBERWR #2 38 T-5 BLUE (SUTURE) IMPLANT
SYR BULB 3OZ (MISCELLANEOUS) IMPLANT
TAPE FIBER 2MM 7IN #2 BLUE (SUTURE) IMPLANT
TOWEL OR 17X24 6PK STRL BLUE (TOWEL DISPOSABLE) ×3 IMPLANT
TOWEL OR NON WOVEN STRL DISP B (DISPOSABLE) ×3 IMPLANT
TUBE CONNECTING 20X1/4 (TUBING) ×3 IMPLANT
TUBING ARTHROSCOPY IRRIG 16FT (MISCELLANEOUS) ×3 IMPLANT
WAND STAR VAC 90 (SURGICAL WAND) ×3 IMPLANT
WATER STERILE IRR 1000ML POUR (IV SOLUTION) ×3 IMPLANT
YANKAUER SUCT BULB TIP NO VENT (SUCTIONS) IMPLANT

## 2014-12-10 NOTE — Transfer of Care (Signed)
Immediate Anesthesia Transfer of Care Note  Patient: BURNETTE SAUTTER  Procedure(s) Performed: Procedure(s): SHOULDER ARTHROSCOPY WITH ROTATOR CUFF REPAIR DEBRIDMENT OF ARTHRITIS LYSIS OF ADHESIONS LEFT SHOULDER MANIPULATION (Left)  Patient Location: PACU  Anesthesia Type:General and GA combined with regional for post-op pain  Level of Consciousness: sedated  Airway & Oxygen Therapy: Patient Spontanous Breathing and Patient connected to face mask oxygen  Post-op Assessment: Report given to RN and Post -op Vital signs reviewed and stable  Post vital signs: Reviewed and stable  Last Vitals:  Filed Vitals:   12/10/14 1300  BP: 117/68  Pulse: 75  Temp:   Resp: 12    Complications: No apparent anesthesia complications

## 2014-12-10 NOTE — Progress Notes (Signed)
Assisted Dr. Ossey with right, ultrasound guided, interscalene  block. Side rails up, monitors on throughout procedure. See vital signs in flow sheet. Tolerated Procedure well. 

## 2014-12-10 NOTE — H&P (Signed)
Devin Mcdonald is an 59 y.o. male.   Chief Complaint: L shoulder pain  HPI: s/p 2 L shoulder surgeries with continued pain, c/o possible nonhealed RCR.  Past Medical History  Diagnosis Date  . No pertinent past medical history   . Wears glasses   . History of renal insufficiency syndrome   . Gout   . Dental bridge present   . Arthritis     Past Surgical History  Procedure Laterality Date  . Arthroplasty      left knee 25 years ago  . Colonoscopy    . Tonsillectomy    . Shoulder arthroscopy with rotator cuff repair Left 02/20/2013    Procedure: SHOULDER ARTHROSCOPY WITH ARTHROSCOPIC  ROTATOR CUFF REPAIR, SUBACROMIAL DECOMPRESSION, DEBRIDEMENT OF ARTHRITIS;  Surgeon: Nita Sells, MD;  Location: [facility];  Service: Orthopedics;  Laterality: Left;  . Shoulder arthroscopy with biceps tendon repair Left 07/31/2013    Procedure: Left shoulder diagnostic arthroscopy with possible manipulation, rotator cuff repair, biceps tenodesis.;  Surgeon: Nita Sells, MD;  Location: [facility];  Service: Orthopedics;  Laterality: Left;  Left shoulder diagnostic arthroscopy with possible manipulation, rotator cuff repair, biceps tenodesis.  . Joint replacement Left     History reviewed. No pertinent family history. Social History:  reports that he has never smoked. He has never used smokeless tobacco. He reports that he does not drink alcohol or use illicit drugs.  Allergies: No Known Allergies  Medications Prior to Admission  Medication Sig Dispense Refill  . allopurinol (ZYLOPRIM) 300 MG tablet Take 300 mg by mouth daily.      Results for orders placed or performed during the hospital encounter of 12/10/14 (from the past 48 hour(s))  Hemoglobin-hemacue, POC     Status: None   Collection Time: 12/10/14 12:26 PM  Result Value Ref Range   Hemoglobin 14.6 13.0 - 17.0 g/dL   No results found.  Review of Systems  All other systems reviewed  and are negative.   Blood pressure 148/88, pulse 63, temperature 98.3 F (36.8 C), temperature source Oral, resp. rate 20, height 6' (1.829 m), weight 99.791 kg (220 lb), SpO2 98 %. Physical Exam  Constitutional: He is oriented to person, place, and time. He appears well-developed and well-nourished.  HENT:  Head: Atraumatic.  Eyes: EOM are normal.  Cardiovascular: Intact distal pulses.   Respiratory: Effort normal.  Musculoskeletal:  L shoulder pain with RC testing.NVID  Neurological: He is alert and oriented to person, place, and time.  Skin: Skin is warm and dry.  Psychiatric: He has a normal mood and affect.     Assessment/Plan R shoulder c/o nonhealed RCR Plan diagnostic arthroscopy, possible revision RCR. Risks / benefits of surgery discussed Consent on chart  NPO for OR Preop antibiotics   Jannett Schmall WILLIAM 12/10/2014, 12:36 PM

## 2014-12-10 NOTE — Discharge Instructions (Signed)
Discharge Instructions after Arthroscopic Shoulder Surgery ° ° °A sling has been provided for you. You may remove the sling after 72 hours. The sling may be worn for your protection, if you are in a crowd.  °Use ice on the shoulder intermittently over the first 48 hours after surgery.  °Pain medication has been prescribed for you.  °Use your medication liberally over the first 48 hours, and then begin to taper your use. You may take Extra Strength Tylenol or Tylenol only in place of the pain pills. DO NOT take ANY nonsteroidal anti-inflammatory pain medications: Advil, Motrin, Ibuprofen, Aleve, Naproxen, or Naprosyn.  °You may remove your dressing after two days.  °You may shower 5 days after surgery. The incision CANNOT get wet prior to 5 days. Simply allow the water to wash over the site and then pat dry. Do not rub the incision. Make sure your axilla (armpit) is completely dry after showering.  °Take one aspirin a day for 2 weeks after surgery, unless you have an aspirin sensitivity/allergy or asthma.  °Three to 5 times each day you should perform assisted overhead reaching and external rotation (outward turning) exercises with the operative arm. Both exercises should be done with the non-operative arm used as the "therapist arm" while the operative arm remains relaxed. Ten of each exercise should be done three to five times each day. ° ° ° °Overhead reach is helping to lift your stiff arm up as high as it will go. To stretch your overhead reach, lie flat on your back, relax, and grasp the wrist of the tight shoulder with your opposite hand. Using the power in your opposite arm, bring the stiff arm up as far as it is comfortable. Start holding it for ten seconds and then work up to where you can hold it for a count of 30. Breathe slowly and deeply while the arm is moved. Repeat this stretch ten times, trying to help the arm up a little higher each time.  ° ° ° ° ° °External rotation is turning the arm out to  the side while your elbow stays close to your body. External rotation is best stretched while you are lying on your back. Hold a cane, yardstick, broom handle, or dowel in both hands. Bend both elbows to a right angle. Use steady, gentle force from your normal arm to rotate the hand of the stiff shoulder out away from your body. Continue the rotation as far as it will go comfortably, holding it there for a count of 10. Repeat this exercise ten times.  ° ° ° °Please call [phone] during normal business hours or [phone] after hours for any problems. Including the following: ° °- excessive redness of the incisions °- drainage for more than 4 days °- fever of more than 101.5 F ° °*Please note that pain medications will not be refilled after hours or on weekends. ° ° ° °Post Anesthesia Home Care Instructions ° °Activity: °Get plenty of rest for the remainder of the day. A responsible adult should stay with you for 24 hours following the procedure.  °For the next 24 hours, DO NOT: °-Drive a car °-Operate machinery °-Drink alcoholic beverages °-Take any medication unless instructed by your physician °-Make any legal decisions or sign important papers. ° °Meals: °Start with liquid foods such as gelatin or soup. Progress to regular foods as tolerated. Avoid greasy, spicy, heavy foods. If nausea and/or vomiting occur, drink only clear liquids until the nausea and/or vomiting subsides. Call   your physician if vomiting continues. ° °Special Instructions/Symptoms: °Your throat may feel dry or sore from the anesthesia or the breathing tube placed in your throat during surgery. If this causes discomfort, gargle with warm salt water. The discomfort should disappear within 24 hours. ° °If you had a scopolamine patch placed behind your ear for the management of post- operative nausea and/or vomiting: ° °1. The medication in the patch is effective for 72 hours, after which it should be removed.  Wrap patch in a tissue and  discard in the trash. Wash hands thoroughly with soap and water. °2. You may remove the patch earlier than 72 hours if you experience unpleasant side effects which may include dry mouth, dizziness or visual disturbances. °3. Avoid touching the patch. Wash your hands with soap and water after contact with the patch. ° ° °  °Regional Anesthesia Blocks ° °1. Numbness or the inability to move the "blocked" extremity may last from 3-48 hours after placement. The length of time depends on the medication injected and your individual response to the medication. If the numbness is not going away after 48 hours, call your surgeon. ° °2. The extremity that is blocked will need to be protected until the numbness is gone and the  Strength has returned. Because you cannot feel it, you will need to take extra care to avoid injury. Because it may be weak, you may have difficulty moving it or using it. You may not know what position it is in without looking at it while the block is in effect. ° °3. For blocks in the legs and feet, returning to weight bearing and walking needs to be done carefully. You will need to wait until the numbness is entirely gone and the strength has returned. You should be able to move your leg and foot normally before you try and bear weight or walk. You will need someone to be with you when you first try to ensure you do not fall and possibly risk injury. ° °4. Bruising and tenderness at the needle site are common side effects and will resolve in a few days. ° °5. Persistent numbness or new problems with movement should be communicated to the surgeon or the [facility] ([phone])/ [facility] ([phone]). °

## 2014-12-10 NOTE — Anesthesia Procedure Notes (Addendum)
Anesthesia Regional Block:  Interscalene brachial plexus block  Pre-Anesthetic Checklist: ,, timeout performed, Correct Patient, Correct Site, Correct Laterality, Correct Procedure, Correct Position, site marked, Risks and benefits discussed,  Surgical consent,  Pre-op evaluation,  At surgeon's request and post-op pain management  Laterality: Left  Prep: chloraprep       Needles:  Injection technique: Single-shot  Needle Type: Echogenic Stimulator Needle     Needle Length: 9cm 9 cm Needle Gauge: 21 and 21 G    Additional Needles:  Procedures: ultrasound guided (picture in chart) and nerve stimulator Interscalene brachial plexus block  Nerve Stimulator or Paresthesia:  Response: 0.4 mA,   Additional Responses:   Narrative:  Start time: 12/10/2014 1:04 PM End time: 12/10/2014 1:00 PM Injection made incrementally with aspirations every 5 mL. Anesthesiologist: Lillia Abed  Additional Notes: Monitors applied. Patient sedated. Sterile prep and drape,hand hygiene and sterile gloves were used. Relevant anatomy identified.Needle position confirmed.Local anesthetic injected incrementally after negative aspiration. Local anesthetic spread visualized around nerve(s). Vascular puncture avoided. No complications. Image printed for medical record.The patient tolerated the procedure well.        Procedure Name: Intubation Date/Time: 12/10/2014 1:17 PM Performed by: Lieutenant Diego Pre-anesthesia Checklist: Patient identified, Emergency Drugs available, Suction available and Patient being monitored Patient Re-evaluated:Patient Re-evaluated prior to inductionOxygen Delivery Method: Circle System Utilized Preoxygenation: Pre-oxygenation with 100% oxygen Intubation Type: IV induction Ventilation: Mask ventilation without difficulty Laryngoscope Size: Miller and 2 Tube type: Oral Tube size: 7.0 mm Number of attempts: 1 Airway Equipment and Method: Stylet and Oral airway Placement  Confirmation: ETT inserted through vocal cords under direct vision,  positive ETCO2 and breath sounds checked- equal and bilateral Secured at: 24 cm Tube secured with: Tape Dental Injury: Teeth and Oropharynx as per pre-operative assessment

## 2014-12-10 NOTE — Anesthesia Postprocedure Evaluation (Signed)
Anesthesia Post Note  Patient: Devin Mcdonald  Procedure(s) Performed: Procedure(s) (LRB): SHOULDER ARTHROSCOPY  DEBRIDMENT OF ARTHRITIS LYSIS OF ADHESIONS LEFT SHOULDER MANIPULATION (Left) SHOULDER ARTHROSCOPY  DEBRIDMENT OF ARTHRITIS LYSIS OF ADHESIONS LEFT SHOULDER MANIPULATION (Left) SHOULDER ARTHROSCOPY  DEBRIDMENT OF ARTHRITIS LYSIS OF ADHESIONS LEFT SHOULDER MANIPULATION (Left)  Anesthesia type: general  Patient location: PACU  Post pain: Pain level controlled  Post assessment: Patient's Cardiovascular Status Stable  Last Vitals:  Filed Vitals:   12/10/14 1515  BP: 130/85  Pulse: 70  Temp: 36.9 C  Resp: 16    Post vital signs: Reviewed and stable  Level of consciousness: sedated  Complications: No apparent anesthesia complications

## 2014-12-10 NOTE — Anesthesia Preprocedure Evaluation (Signed)
Anesthesia Evaluation  Patient identified by MRN, date of birth, ID band Patient awake    Reviewed: Allergy & Precautions, NPO status , Patient's Chart, lab work & pertinent test results  Airway Mallampati: I  TM Distance: >3 FB Neck ROM: Full    Dental   Pulmonary    Pulmonary exam normal       Cardiovascular Normal cardiovascular exam    Neuro/Psych    GI/Hepatic   Endo/Other    Renal/GU      Musculoskeletal   Abdominal   Peds  Hematology   Anesthesia Other Findings   Reproductive/Obstetrics                             Anesthesia Physical Anesthesia Plan  ASA: II  Anesthesia Plan: General   Post-op Pain Management: GA combined w/ Regional for post-op pain   Induction: Intravenous  Airway Management Planned: Oral ETT  Additional Equipment:   Intra-op Plan:   Post-operative Plan: Extubation in OR  Informed Consent: I have reviewed the patients History and Physical, chart, labs and discussed the procedure including the risks, benefits and alternatives for the proposed anesthesia with the patient or authorized representative who has indicated his/her understanding and acceptance.     Plan Discussed with: CRNA and Surgeon  Anesthesia Plan Comments:         Anesthesia Quick Evaluation

## 2014-12-10 NOTE — Op Note (Signed)
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/2014  Procedure(s) and Anesthesia Type: #1 left shoulder arthroscopic debridement glenohumeral arthritis #2 left shoulder lysis of adhesions with manipulation under anesthesia   General  Surgeon(s) and Role:    * Tania Ade, MD - Primary     Surgeon: Nita Sells   Assistants: Jeanmarie Hubert PA-C (Danielle was present and scrubbed throughout the procedure and was essential in positioning, assisting with the camera and instrumentation,, and closure)  Anesthesia: General endotracheal anesthesia with preoperative interscalene block given by the attending anesthesiologist    Procedure Detail  SHOULDER ARTHROSCOPY WITH ROTATOR CUFF REPAIR DEBRIDMENT OF ARTHRITIS LYSIS OF ADHESIONS  Estimated Blood Loss: Min         Drains: none  Blood Given: none         Specimens: none        Complications:  * No complications entered in OR log *         Disposition: PACU - hemodynamically stable.         Condition: stable    Procedure:   INDICATIONS FOR SURGERY: The patient is 59 y.o. male who Has had 2 previous left shoulder surgeries with rotator cuff repair with continued pain and dysfunction. Ultimately he had a second opinion and it was felt that he possibly had an area of residual nonhealed rotator cuff after surgery and it was worth another look to see if there is some areas rotator cuff which needs revision repair.He also was noted to have some degenerative disease Of the glenohumeral joint. It was felt that another look at the glenohumeral articular cartilage would be worthwhile before considering something further like a shoulder replacement surgery.  OPERATIVE FINDINGS: Examination under anesthesia: Mild residual stiffness with 140 forward flexion, external rotation to 35  DESCRIPTION OF PROCEDURE: The  patient was identified in preoperative  holding area where I personally marked the operative site after  verifying site, side, and procedure with the patient. An interscalene block was given by the attending anesthesiologist the holding area.  The patient was taken back to the operating room where general anesthesia was induced without complication and was placed in the beach-chair position with the back  elevated about 60 degrees and all extremities and head and neck carefully padded and  positioned.   The left upper extremity was then prepped and  draped in a standard sterile fashion. The appropriate time-out  procedure was carried out. The patient did receive IV antibiotics  within 30 minutes of incision.   A small posterior portal incision was made and the arthroscope was introduced into the joint. An anterior portal was then established above the subscapularis using needle localization. Small cannula was placed anteriorly. Diagnostic arthroscopy was then carried out.   The previous subscapularis repair was noted to be intact and completely healed. The inner suture was visible but not prominent and was left alone. He was noted to have fairly significant scar tissue in the rotator interval. Biceps tendon was absent. No significant labral tears were noted. 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 There were no significant areas of completely denuded bone. The articular surface of the supraspinatus and infraspinatus were examined and found to be intact with no evidence for significant exposure of the tuberosity. There was one small flap tear  anterior which was debrided lightly.  The arthroscope was then introduced into the subacromial space a standard lateral portal was established with needle localization.  The bursal surface the rotator cuff was carefully examined. There was not noted  to be any significant bursitis. The subacromial space was completely decompressed with no signs of impingement. The bursal surface the rotator cuff was carefully examined anterior to posterior and noted to be completely intact without any thinning articular sided tears. No sutures were visible. Anteriorly he was noted to have some adhesions between the anterior rotator cuff and the anterior deltoid fascia. This was carefully lysed with the ArthroCare. Subcarinal space was viewed from the lateral portal. There was a small bursal full posteriorly which was debrided and small areas of adhesions which were lysed again with the ArthriCare. After careful probing of the entire rotator cuff insertion I did feel that it was completely intact with no evidence for recurrent or residual tear and no areas that were significantly thinned.  The arthroscopic equipment was removed from the joint and the portals were closed with 3-0 nylon in an interrupted fashion. Sterile dressings were then applied including Xeroform 4 x 4's ABDs and tape. The patient was then allowed to awaken from general anesthesia, placed in a sling, transferred to the stretcher and taken to the recovery room in stable condition.   POSTOPERATIVE PLAN: The patient will be discharged home today and will followup in one week for suture removal and wound check.  We will get him into physical therapy right away to work on stretching.

## 2014-12-11 ENCOUNTER — Encounter (HOSPITAL_BASED_OUTPATIENT_CLINIC_OR_DEPARTMENT_OTHER): Payer: Self-pay | Admitting: Orthopedic Surgery

## 2015-03-20 IMAGING — CR DG SHOULDER 2+V*L*
3 series · 3 of 3 positions shown · non-contrast
Comparison: None.

CLINICAL DATA: RIGHT SHOULDER INJURY AND PAIN.

EXAM:
LEFT SHOULDER - 2+ VIEW

[AP]
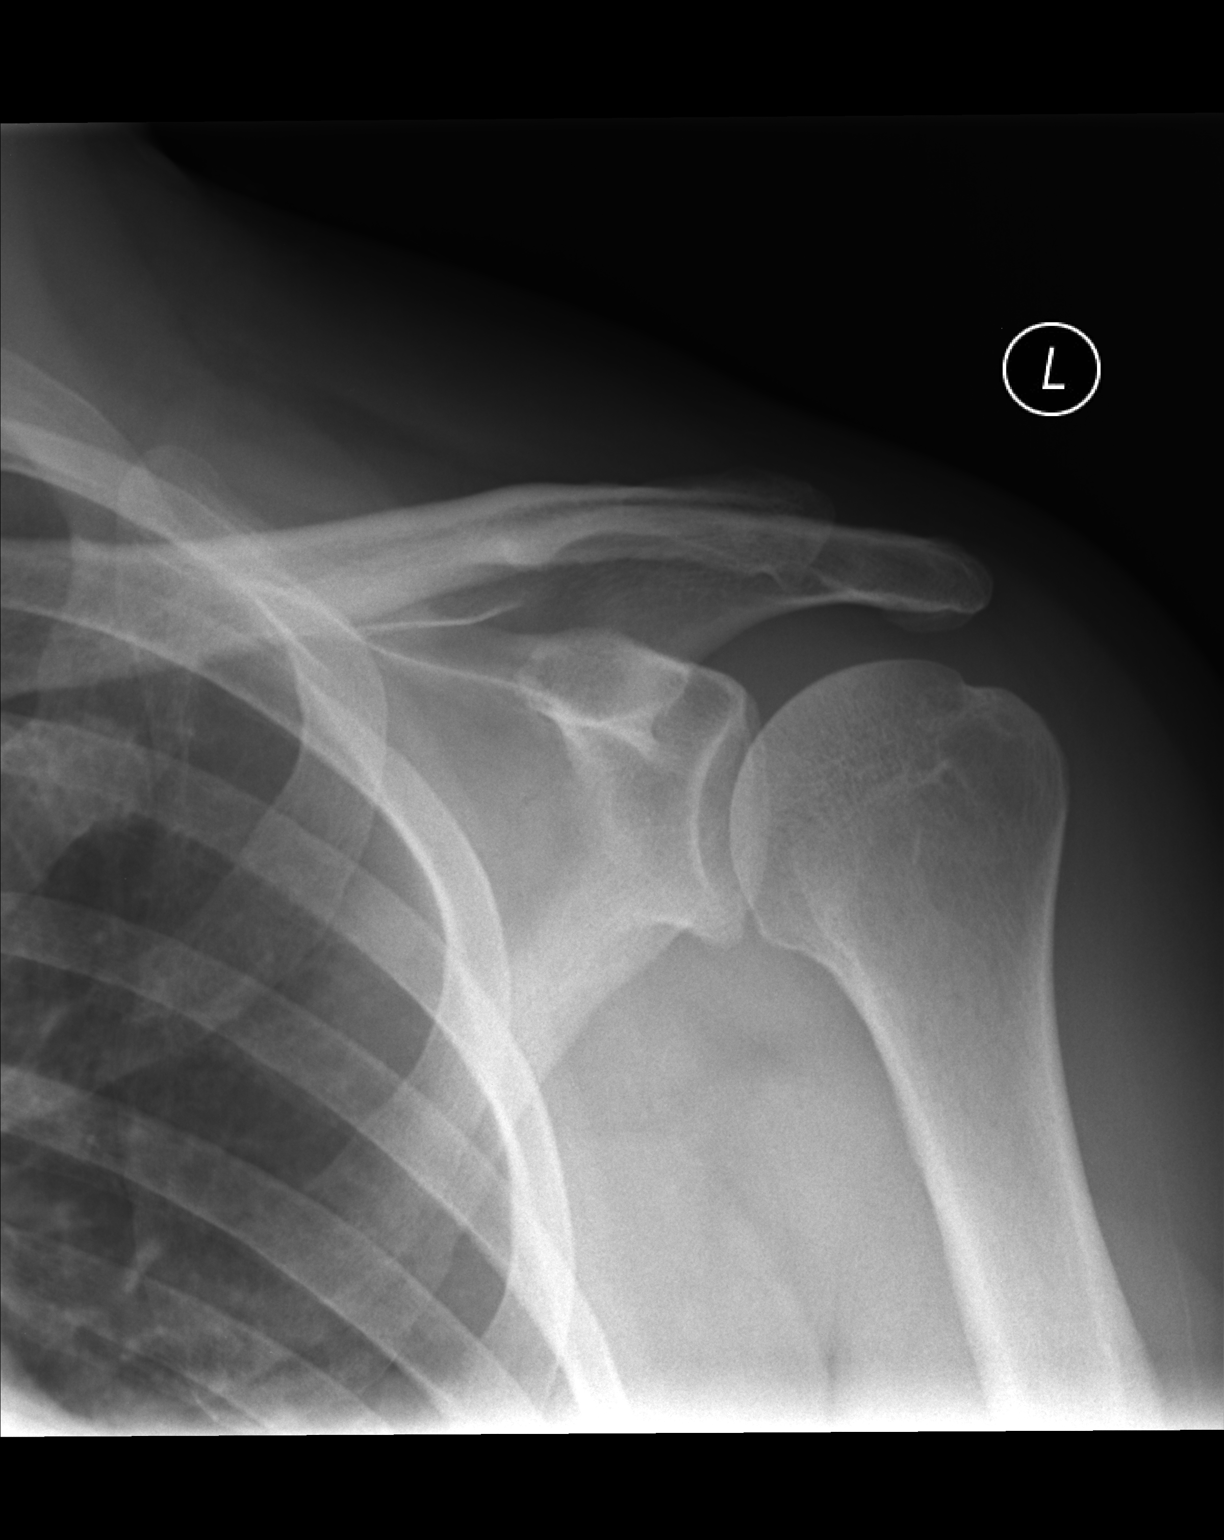

[pa y view]
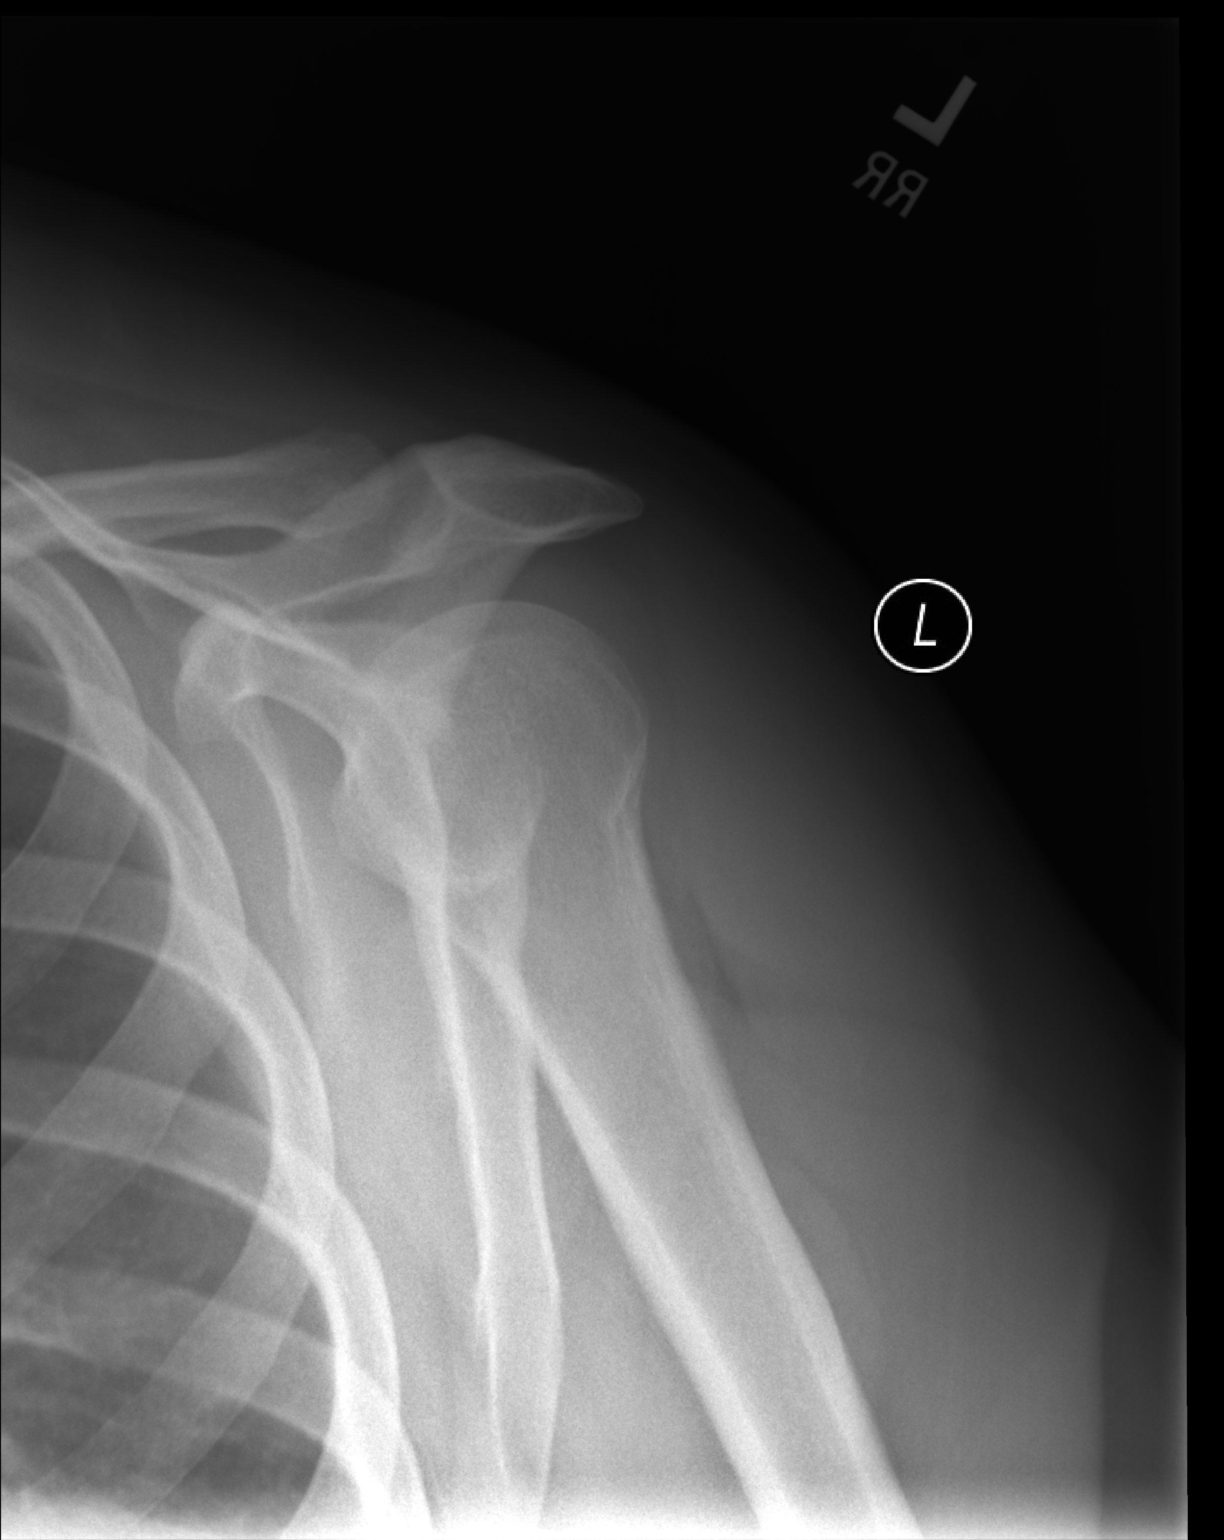

[axial]
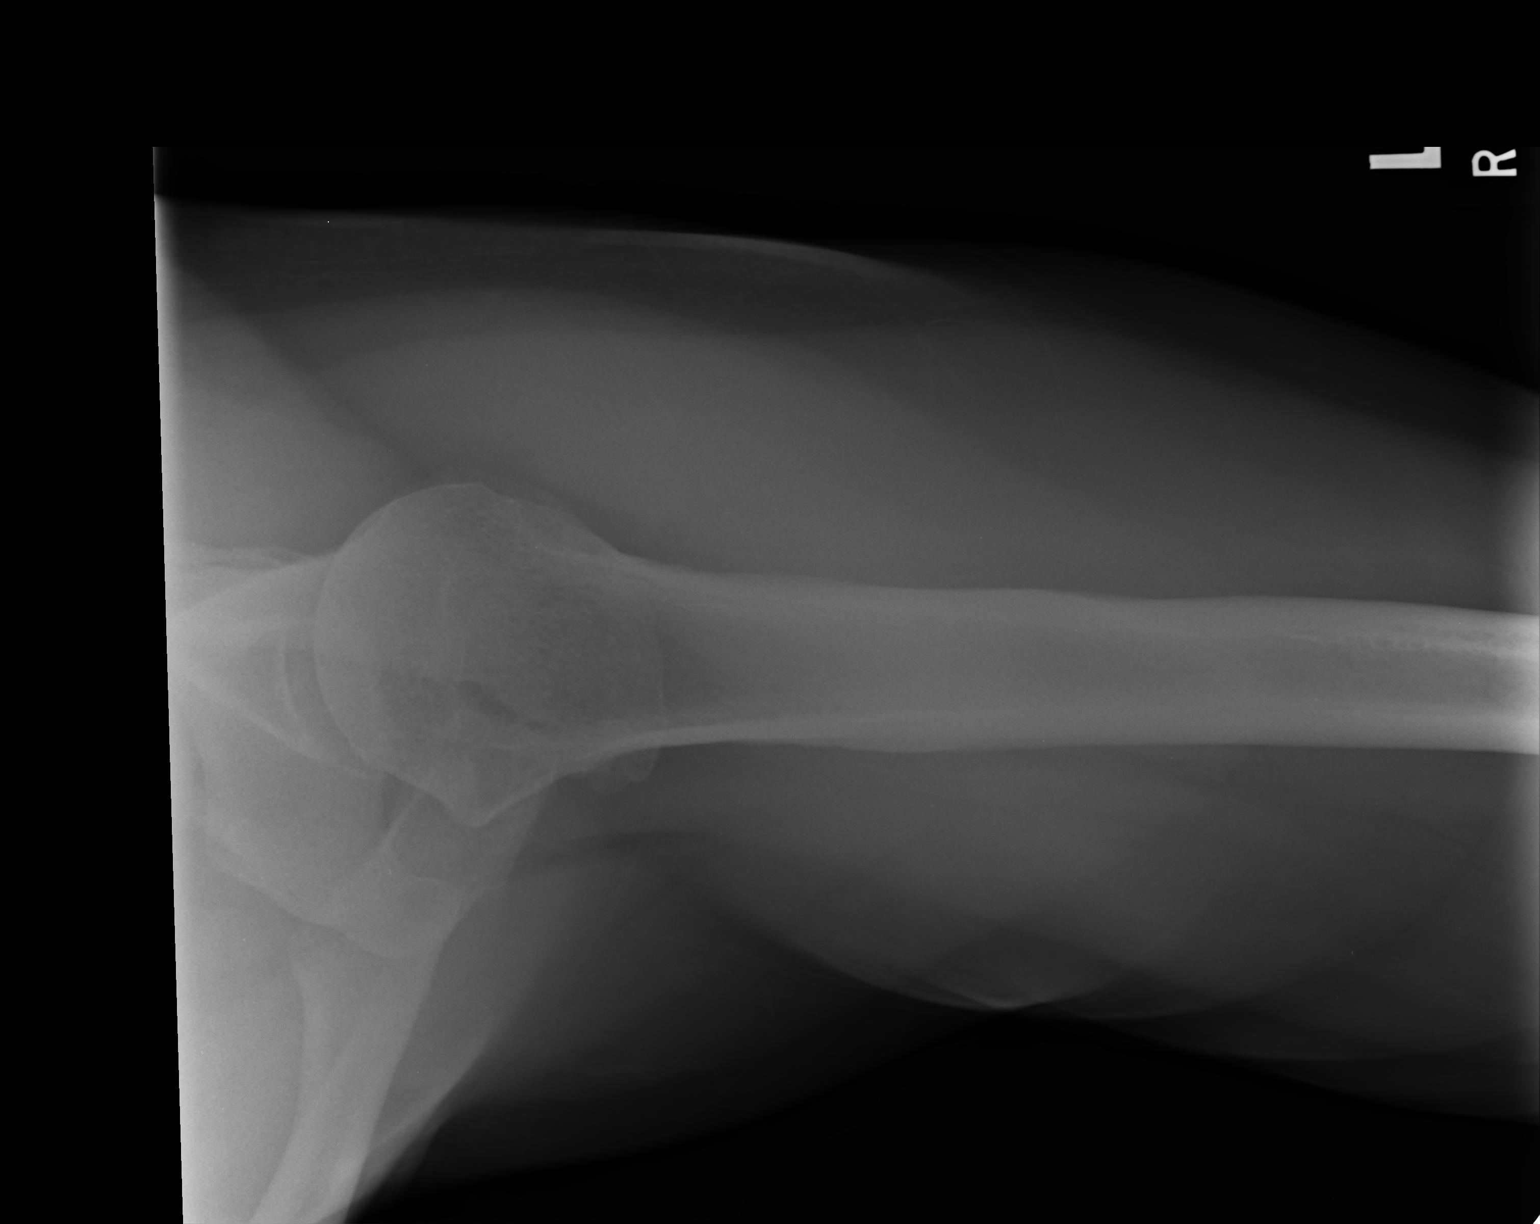

[3 of 3 positions shown; findings below may reference images not displayed]

FINDINGS: There is no evidence of fracture or dislocation. There is no
evidence of arthropathy or other focal bone abnormality. Soft
tissues are unremarkable.
IMPRESSION: Negative.

## 2017-12-15 DIAGNOSIS — Z0000 Encounter for general adult medical examination without abnormal findings: Secondary | ICD-10-CM | POA: Diagnosis not present

## 2017-12-15 DIAGNOSIS — E669 Obesity, unspecified: Secondary | ICD-10-CM | POA: Diagnosis not present

## 2017-12-15 DIAGNOSIS — M109 Gout, unspecified: Secondary | ICD-10-CM | POA: Diagnosis not present

## 2017-12-15 DIAGNOSIS — Z13220 Encounter for screening for lipoid disorders: Secondary | ICD-10-CM | POA: Diagnosis not present

## 2017-12-15 DIAGNOSIS — Z1159 Encounter for screening for other viral diseases: Secondary | ICD-10-CM | POA: Diagnosis not present

## 2017-12-15 DIAGNOSIS — Z125 Encounter for screening for malignant neoplasm of prostate: Secondary | ICD-10-CM | POA: Diagnosis not present

## 2018-01-17 DIAGNOSIS — N183 Chronic kidney disease, stage 3 (moderate): Secondary | ICD-10-CM | POA: Diagnosis not present

## 2018-01-19 ENCOUNTER — Other Ambulatory Visit: Payer: Self-pay | Admitting: Family Medicine

## 2018-01-19 DIAGNOSIS — N183 Chronic kidney disease, stage 3 unspecified: Secondary | ICD-10-CM

## 2018-01-27 ENCOUNTER — Ambulatory Visit
Admission: RE | Admit: 2018-01-27 | Discharge: 2018-01-27 | Disposition: A | Discharge status: Home / Self Care | Payer: BLUE CROSS/BLUE SHIELD | Source: Ambulatory Visit | Attending: Family Medicine | Admitting: Family Medicine

## 2018-01-27 DIAGNOSIS — N183 Chronic kidney disease, stage 3 unspecified: Secondary | ICD-10-CM

## 2018-03-09 DIAGNOSIS — Z6834 Body mass index (BMI) 34.0-34.9, adult: Secondary | ICD-10-CM | POA: Diagnosis not present

## 2018-03-09 DIAGNOSIS — N183 Chronic kidney disease, stage 3 (moderate): Secondary | ICD-10-CM | POA: Diagnosis not present

## 2018-03-09 DIAGNOSIS — E669 Obesity, unspecified: Secondary | ICD-10-CM | POA: Diagnosis not present

## 2018-03-09 DIAGNOSIS — E7800 Pure hypercholesterolemia, unspecified: Secondary | ICD-10-CM | POA: Diagnosis not present

## 2018-06-13 DIAGNOSIS — N183 Chronic kidney disease, stage 3 (moderate): Secondary | ICD-10-CM | POA: Diagnosis not present

## 2018-06-13 DIAGNOSIS — E7800 Pure hypercholesterolemia, unspecified: Secondary | ICD-10-CM | POA: Diagnosis not present

## 2018-06-15 DIAGNOSIS — E7800 Pure hypercholesterolemia, unspecified: Secondary | ICD-10-CM | POA: Diagnosis not present

## 2018-06-15 DIAGNOSIS — N183 Chronic kidney disease, stage 3 (moderate): Secondary | ICD-10-CM | POA: Diagnosis not present

## 2019-03-29 DIAGNOSIS — Z20828 Contact with and (suspected) exposure to other viral communicable diseases: Secondary | ICD-10-CM | POA: Diagnosis not present

## 2019-08-30 DIAGNOSIS — Z0000 Encounter for general adult medical examination without abnormal findings: Secondary | ICD-10-CM | POA: Diagnosis not present

## 2019-08-30 DIAGNOSIS — N183 Chronic kidney disease, stage 3 unspecified: Secondary | ICD-10-CM | POA: Diagnosis not present

## 2019-08-30 DIAGNOSIS — R635 Abnormal weight gain: Secondary | ICD-10-CM | POA: Diagnosis not present

## 2019-08-30 DIAGNOSIS — E7800 Pure hypercholesterolemia, unspecified: Secondary | ICD-10-CM | POA: Diagnosis not present

## 2019-08-30 DIAGNOSIS — E669 Obesity, unspecified: Secondary | ICD-10-CM | POA: Diagnosis not present

## 2019-08-30 DIAGNOSIS — Z8249 Family history of ischemic heart disease and other diseases of the circulatory system: Secondary | ICD-10-CM | POA: Diagnosis not present

## 2019-08-30 DIAGNOSIS — Z125 Encounter for screening for malignant neoplasm of prostate: Secondary | ICD-10-CM | POA: Diagnosis not present

## 2019-09-04 DIAGNOSIS — Z23 Encounter for immunization: Secondary | ICD-10-CM | POA: Diagnosis not present

## 2019-11-07 DIAGNOSIS — Z833 Family history of diabetes mellitus: Secondary | ICD-10-CM | POA: Diagnosis not present

## 2019-11-07 DIAGNOSIS — E669 Obesity, unspecified: Secondary | ICD-10-CM | POA: Diagnosis not present

## 2019-11-07 DIAGNOSIS — R030 Elevated blood-pressure reading, without diagnosis of hypertension: Secondary | ICD-10-CM | POA: Diagnosis not present

## 2019-11-07 DIAGNOSIS — Z825 Family history of asthma and other chronic lower respiratory diseases: Secondary | ICD-10-CM | POA: Diagnosis not present

## 2019-11-07 DIAGNOSIS — Z6839 Body mass index (BMI) 39.0-39.9, adult: Secondary | ICD-10-CM | POA: Diagnosis not present

## 2019-12-20 DIAGNOSIS — R69 Illness, unspecified: Secondary | ICD-10-CM | POA: Diagnosis not present

## 2020-03-06 DIAGNOSIS — R7303 Prediabetes: Secondary | ICD-10-CM | POA: Diagnosis not present

## 2020-03-06 DIAGNOSIS — E7800 Pure hypercholesterolemia, unspecified: Secondary | ICD-10-CM | POA: Diagnosis not present

## 2020-03-06 DIAGNOSIS — Z8371 Family history of colonic polyps: Secondary | ICD-10-CM | POA: Diagnosis not present

## 2020-04-09 DIAGNOSIS — Z1159 Encounter for screening for other viral diseases: Secondary | ICD-10-CM | POA: Diagnosis not present

## 2020-04-12 DIAGNOSIS — D120 Benign neoplasm of cecum: Secondary | ICD-10-CM | POA: Diagnosis not present

## 2020-04-12 DIAGNOSIS — Z8371 Family history of colonic polyps: Secondary | ICD-10-CM | POA: Diagnosis not present

## 2020-04-12 DIAGNOSIS — K5730 Diverticulosis of large intestine without perforation or abscess without bleeding: Secondary | ICD-10-CM | POA: Diagnosis not present

## 2020-04-16 DIAGNOSIS — D120 Benign neoplasm of cecum: Secondary | ICD-10-CM | POA: Diagnosis not present

## 2020-09-30 DIAGNOSIS — Z825 Family history of asthma and other chronic lower respiratory diseases: Secondary | ICD-10-CM | POA: Diagnosis not present

## 2020-09-30 DIAGNOSIS — E669 Obesity, unspecified: Secondary | ICD-10-CM | POA: Diagnosis not present

## 2020-09-30 DIAGNOSIS — Z833 Family history of diabetes mellitus: Secondary | ICD-10-CM | POA: Diagnosis not present

## 2020-09-30 DIAGNOSIS — Z6831 Body mass index (BMI) 31.0-31.9, adult: Secondary | ICD-10-CM | POA: Diagnosis not present

## 2020-09-30 DIAGNOSIS — Z8249 Family history of ischemic heart disease and other diseases of the circulatory system: Secondary | ICD-10-CM | POA: Diagnosis not present

## 2020-10-21 DIAGNOSIS — H31093 Other chorioretinal scars, bilateral: Secondary | ICD-10-CM | POA: Diagnosis not present

## 2020-11-26 DIAGNOSIS — R7303 Prediabetes: Secondary | ICD-10-CM | POA: Diagnosis not present

## 2020-11-26 DIAGNOSIS — Z6831 Body mass index (BMI) 31.0-31.9, adult: Secondary | ICD-10-CM | POA: Diagnosis not present

## 2020-11-26 DIAGNOSIS — Z0000 Encounter for general adult medical examination without abnormal findings: Secondary | ICD-10-CM | POA: Diagnosis not present

## 2020-11-26 DIAGNOSIS — K635 Polyp of colon: Secondary | ICD-10-CM | POA: Diagnosis not present

## 2020-11-26 DIAGNOSIS — E7800 Pure hypercholesterolemia, unspecified: Secondary | ICD-10-CM | POA: Diagnosis not present

## 2020-11-26 DIAGNOSIS — E669 Obesity, unspecified: Secondary | ICD-10-CM | POA: Diagnosis not present

## 2020-11-26 DIAGNOSIS — Z23 Encounter for immunization: Secondary | ICD-10-CM | POA: Diagnosis not present

## 2021-09-22 NOTE — Progress Notes (Signed)
  Subjective:  Patient ID: Devin Mcdonald is a 66 y.o. male.  Chief Complaint  Patient presents with  . DOT Physical     HPI  HPI       Review of Systems  Social History   Tobacco Use  Smoking Status Never  Smokeless Tobacco Never   History reviewed. No pertinent past medical history. Past Surgical History:  Procedure Laterality Date  . SHOULDER ARTHROSCOPY W/ ROTATOR CUFF REPAIR     No family history on file. Objective:      Physical Exam    Assessment/Plan:  HPI provided by Self  Based on today's visit:history, physical exam and all relevant testing completed in clinic today patient's visit diagnosis is/includes  1. Need for history and physical examination for employment   2. Encounter for commercial driving license (CDL) exam    Patient does not have a history of chronic conditions.  Treatment plan includes:  Orders Placed: Orders Placed This Encounter  Procedures  . DOT Physical  . Qualifying Decision  . Additional Questions  . POCT urinalysis dipstick   Medications ordered this visit    No prescriptions requested or ordered in this encounter    Current medication list and any new medications prescribed or recommended today were reviewed with the patient and specific instructions were provided Yes  Provider Recommendations    Follow up care instructions were provided and reviewed?with the  Patient. All questions were answered. Patient verbalized understanding of plan of care today.          STOP-Bang Sleep Apnea Risk Calculation: Low Risk                                  Devin Mcdonald does not currently have medications on file.

## 2021-11-24 DIAGNOSIS — H35362 Drusen (degenerative) of macula, left eye: Secondary | ICD-10-CM | POA: Diagnosis not present

## 2022-05-25 DIAGNOSIS — R7303 Prediabetes: Secondary | ICD-10-CM | POA: Diagnosis not present

## 2022-05-25 DIAGNOSIS — E7800 Pure hypercholesterolemia, unspecified: Secondary | ICD-10-CM | POA: Diagnosis not present

## 2022-05-25 DIAGNOSIS — Z86010 Personal history of colonic polyps: Secondary | ICD-10-CM | POA: Diagnosis not present

## 2022-05-25 DIAGNOSIS — Z0000 Encounter for general adult medical examination without abnormal findings: Secondary | ICD-10-CM | POA: Diagnosis not present

## 2022-05-25 DIAGNOSIS — Z23 Encounter for immunization: Secondary | ICD-10-CM | POA: Diagnosis not present

## 2022-05-25 DIAGNOSIS — Z125 Encounter for screening for malignant neoplasm of prostate: Secondary | ICD-10-CM | POA: Diagnosis not present

## 2022-05-25 DIAGNOSIS — Z6836 Body mass index (BMI) 36.0-36.9, adult: Secondary | ICD-10-CM | POA: Diagnosis not present

## 2022-05-25 DIAGNOSIS — E669 Obesity, unspecified: Secondary | ICD-10-CM | POA: Diagnosis not present

## 2022-10-19 DIAGNOSIS — E7800 Pure hypercholesterolemia, unspecified: Secondary | ICD-10-CM | POA: Diagnosis not present

## 2023-01-11 DIAGNOSIS — H3589 Other specified retinal disorders: Secondary | ICD-10-CM | POA: Diagnosis not present

## 2024-01-05 ENCOUNTER — Other Ambulatory Visit: Payer: Self-pay

## 2024-01-05 ENCOUNTER — Emergency Department (HOSPITAL_BASED_OUTPATIENT_CLINIC_OR_DEPARTMENT_OTHER)

## 2024-01-05 ENCOUNTER — Emergency Department (HOSPITAL_BASED_OUTPATIENT_CLINIC_OR_DEPARTMENT_OTHER)
Admission: EM | Admit: 2024-01-05 | Discharge: 2024-01-05 | Disposition: A | Discharge status: Home / Self Care | Source: Ambulatory Visit | Attending: Emergency Medicine | Admitting: Emergency Medicine

## 2024-01-05 DIAGNOSIS — H5500 Unspecified nystagmus: Secondary | ICD-10-CM | POA: Diagnosis not present

## 2024-01-05 DIAGNOSIS — R262 Difficulty in walking, not elsewhere classified: Secondary | ICD-10-CM | POA: Diagnosis not present

## 2024-01-05 DIAGNOSIS — R7303 Prediabetes: Secondary | ICD-10-CM | POA: Diagnosis not present

## 2024-01-05 DIAGNOSIS — R42 Dizziness and giddiness: Secondary | ICD-10-CM | POA: Insufficient documentation

## 2024-01-05 DIAGNOSIS — E669 Obesity, unspecified: Secondary | ICD-10-CM | POA: Insufficient documentation

## 2024-01-05 DIAGNOSIS — I6782 Cerebral ischemia: Secondary | ICD-10-CM | POA: Insufficient documentation

## 2024-01-05 DIAGNOSIS — R1110 Vomiting, unspecified: Secondary | ICD-10-CM | POA: Diagnosis not present

## 2024-01-05 LAB — COMPREHENSIVE METABOLIC PANEL WITH GFR
ALT: 30 U/L (ref 0–44)
AST: 30 U/L (ref 15–41)
Albumin: 4.7 g/dL (ref 3.5–5.0)
Alkaline Phosphatase: 80 U/L (ref 38–126)
Anion gap: 15 (ref 5–15)
BUN: 16 mg/dL (ref 8–23)
CO2: 21 mmol/L — ABNORMAL LOW (ref 22–32)
Calcium: 10.5 mg/dL — ABNORMAL HIGH (ref 8.9–10.3)
Chloride: 106 mmol/L (ref 98–111)
Creatinine, Ser: 1.33 mg/dL — ABNORMAL HIGH (ref 0.61–1.24)
GFR, Estimated: 58 mL/min — ABNORMAL LOW (ref >60)
Glucose, Bld: 116 mg/dL — ABNORMAL HIGH (ref 70–99)
Potassium: 4 mmol/L (ref 3.5–5.1)
Sodium: 142 mmol/L (ref 135–145)
Total Bilirubin: 1.1 mg/dL (ref 0.0–1.2)
Total Protein: 7.8 g/dL (ref 6.5–8.1)

## 2024-01-05 LAB — DIFFERENTIAL
Abs Immature Granulocytes: 0.02 K/uL (ref 0.00–0.07)
Basophils Absolute: 0 K/uL (ref 0.0–0.1)
Basophils Relative: 0 %
Eosinophils Absolute: 0 K/uL (ref 0.0–0.5)
Eosinophils Relative: 0 %
Immature Granulocytes: 0 %
Lymphocytes Relative: 14 %
Lymphs Abs: 1.2 K/uL (ref 0.7–4.0)
Monocytes Absolute: 0.5 K/uL (ref 0.1–1.0)
Monocytes Relative: 6 %
Neutro Abs: 6.7 K/uL (ref 1.7–7.7)
Neutrophils Relative %: 80 %

## 2024-01-05 LAB — CBC
HCT: 47 % (ref 39.0–52.0)
Hemoglobin: 15.9 g/dL (ref 13.0–17.0)
MCH: 30.7 pg (ref 26.0–34.0)
MCHC: 33.8 g/dL (ref 30.0–36.0)
MCV: 90.7 fL (ref 80.0–100.0)
Platelets: 214 K/uL (ref 150–400)
RBC: 5.18 MIL/uL (ref 4.22–5.81)
RDW: 13.4 % (ref 11.5–15.5)
WBC: 8.6 K/uL (ref 4.0–10.5)
nRBC: 0 % (ref 0.0–0.2)

## 2024-01-05 LAB — URINE DRUG SCREEN
Amphetamines: NEGATIVE
Barbiturates: NEGATIVE
Benzodiazepines: NEGATIVE
Cocaine: NEGATIVE
Fentanyl: NEGATIVE
Methadone Scn, Ur: NEGATIVE
Opiates: NEGATIVE
Tetrahydrocannabinol: NEGATIVE

## 2024-01-05 LAB — PROTIME-INR
INR: 1 (ref 0.8–1.2)
Prothrombin Time: 13.4 s (ref 11.4–15.2)

## 2024-01-05 LAB — APTT: aPTT: 26 s (ref 24–36)

## 2024-01-05 LAB — ETHANOL: Alcohol, Ethyl (B): <15 mg/dL (ref <15)

## 2024-01-05 MED ORDER — DIPHENHYDRAMINE HCL 50 MG/ML IJ SOLN
12.5000 mg | Freq: Once | INTRAMUSCULAR | Status: AC
  Administered 2024-01-05: 12.5 mg via INTRAVENOUS
  Filled 2024-01-05: qty 1

## 2024-01-05 MED ORDER — SODIUM CHLORIDE 0.9 % IV BOLUS
1000.0000 mL | Freq: Once | INTRAVENOUS | Status: AC
  Administered 2024-01-05: 1000 mL via INTRAVENOUS

## 2024-01-05 MED ORDER — METOCLOPRAMIDE HCL 5 MG/ML IJ SOLN
5.0000 mg | Freq: Once | INTRAMUSCULAR | Status: AC
  Administered 2024-01-05: 5 mg via INTRAVENOUS
  Filled 2024-01-05: qty 2

## 2024-01-05 MED ORDER — DEXAMETHASONE 4 MG PO TABS
10.0000 mg | ORAL_TABLET | Freq: Once | ORAL | Status: AC
  Administered 2024-01-05: 10 mg via ORAL
  Filled 2024-01-05: qty 3

## 2024-01-05 MED ORDER — IOHEXOL 350 MG/ML SOLN
75.0000 mL | Freq: Once | INTRAVENOUS | Status: AC | PRN
  Administered 2024-01-05: 75 mL via INTRAVENOUS

## 2024-01-05 MED ORDER — MECLIZINE HCL 25 MG PO TABS
25.0000 mg | ORAL_TABLET | Freq: Once | ORAL | Status: AC
  Administered 2024-01-05: 25 mg via ORAL
  Filled 2024-01-05: qty 1

## 2024-01-05 MED ORDER — MECLIZINE HCL 25 MG PO TABS: 25.0000 mg | ORAL_TABLET | Freq: Three times a day (TID) | ORAL | 0 refills | Status: AC | PRN

## 2024-01-05 MED ORDER — LORAZEPAM 1 MG PO TABS
1.0000 mg | ORAL_TABLET | ORAL | Status: AC | PRN
  Administered 2024-01-05: 1 mg via ORAL
  Filled 2024-01-05: qty 1

## 2024-01-05 NOTE — ED Provider Notes (Signed)
 [place] EMERGENCY DEPARTMENT AT [facility] Provider Note   CSN: [id number] Arrival date & time: 01/05/24  1055     Patient presents with: Dizziness   Devin Mcdonald is a 68 y.o. male.  {Add pertinent medical, surgical, social history, OB history to HPI:32947}  Dizziness    68 year old male with medical history significant for obesity, HLD, prediabetes presenting to the emergency department with a chief complaint of vertigo.  Patient states that he was last normal when he went to sleep 2 nights ago.  He woke up yesterday morning around 3:30 AM (01/04/2024) and had acute onset room spinning dizziness.  He has been unable to stand independently since due to persistent room spinning dizziness.  He had some nausea and vomiting yesterday.  He denies any headaches.  Denies any fevers or chills, no neck discomfort or rigidity.  No other neurologic deficits, no difficulty swallowing or speaking, no facial droop, no numbness or weakness of any extremity.  Prior to Admission medications   Medication Sig Start Date End Date Taking? Authorizing Provider  allopurinol (ZYLOPRIM) 300 MG tablet Take 300 mg by mouth daily.    [provider]  docusate sodium  (COLACE) 100 MG capsule Take 1 capsule (100 mg total) by mouth 3 (three) times daily as needed. 12/10/14   Laliberte, Danielle, PA-C  oxyCODONE -acetaminophen  (ROXICET) 5-325 MG per tablet Take 1-2 tablets by mouth every 4 (four) hours as needed for severe pain. 12/10/14   Laliberte, Danielle, PA-C    Allergies: Patient has no known allergies.    Review of Systems  Neurological:  Positive for dizziness.  All other systems reviewed and are negative.   Updated Vital Signs BP (!) 130/101 (BP Location: Right Arm)   Pulse 91   Temp 97.8 F (36.6 C) (Oral)   Resp 18   SpO2 98%   Physical Exam Vitals and nursing note reviewed.  Constitutional:      General: He is not in acute distress.    Appearance: He is well-developed.   HENT:     Head: Normocephalic and atraumatic.  Eyes:     Conjunctiva/sclera: Conjunctivae normal.  Cardiovascular:     Rate and Rhythm: Normal rate and regular rhythm.     Heart sounds: No murmur heard. Pulmonary:     Effort: Pulmonary effort is normal. No respiratory distress.     Breath sounds: Normal breath sounds.  Abdominal:     Palpations: Abdomen is soft.     Tenderness: There is no abdominal tenderness.  Musculoskeletal:        General: No swelling.     Cervical back: Neck supple.  Skin:    General: Skin is warm and dry.     Capillary Refill: Capillary refill takes less than 2 seconds.  Neurological:     Mental Status: He is alert.     Comments: MENTAL STATUS EXAM:    Orientation: Alert and oriented to person, place and time.  Memory: Cooperative, follows commands well.  Language: Speech is clear and language is normal.   CRANIAL NERVES:    CN 2 (Optic): Visual fields intact to confrontation.  CN 3,4,6 (EOM): Pupils equal and reactive to light. Full extraocular eye movement WITH LEFT BEATING NYSTAGMUS CN 5 (Trigeminal): Facial sensation is normal, no weakness of masticatory muscles.  CN 7 (Facial): No facial weakness or asymmetry.  CN 8 (Auditory): Auditory acuity grossly normal.  CN 9,10 (Glossophar): The uvula is midline, the palate elevates symmetrically.  CN 11 (spinal access):  Normal sternocleidomastoid and trapezius strength.  CN 12 (Hypoglossal): The tongue is midline. No atrophy or fasciculations.SABRA   MOTOR:  Muscle Strength: 5/5RUE, 5/5LUE, 5/5RLE, 5/5LLE.   COORDINATION:   Intact finger-to-nose, no tremor.   SENSATION:   Intact to light touch all four extremities.  GAIT: Gait not assessed   Psychiatric:        Mood and Affect: Mood normal.     (all labs ordered are listed, but only abnormal results are displayed) Labs Reviewed - No data to display  EKG: None  Radiology: No results found.  {Document cardiac monitor, telemetry assessment  procedure when appropriate:32947} Procedures   Medications Ordered in the ED - No data to display    {Click here for ABCD2, HEART and other calculators REFRESH Note before signing:1}                              Medical Decision Making Amount and/or Complexity of Data Reviewed Labs: ordered. Radiology: ordered.  Risk Prescription drug management.    68 year old male with medical history significant for obesity, HLD, prediabetes presenting to the emergency department with a chief complaint of vertigo.  Patient states that he was last normal when he went to sleep 2 nights ago.  He woke up yesterday morning around 3:30 AM (01/04/2024) and had acute onset room spinning dizziness.  He has been unable to stand independently since due to persistent room spinning dizziness.  He had some nausea and vomiting yesterday.  He denies any headaches.  Denies any fevers or chills, no neck discomfort or rigidity.  No other neurologic deficits, no difficulty swallowing or speaking, no facial droop, no numbness or weakness of any extremity.  Medical Decision Making:   Devin Mcdonald is a 68 y.o. male who presented to the ED today with dizziness detailed above.    {crccomplexity:27900} Complete initial physical exam performed, notably the patient  was ***.    I personally performed a HINTS exam Head impulse test: Patient does ***have a corrective saccade to the***. Nystagmus: Patient does ***have nystagmus to the *** on contralateral directional gaze. Test of skew: No skew. Hints exam localizes to the *** nervous system.  Reviewed and confirmed nursing documentation for past medical history, family history, social history.    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 Other diagnoses were considered including (but not limited to) CVA, Labyrinthitis, Vestibular Neuritis, Mnire's  disease, occipital migraine, Inner Ear infection. These are considered less likely due to history of present illness and physical exam findings.   {crccopa:27899} ***Notably, due to duration of symptoms being greater than 4 and half hours, patient is not a candidate for code stroke activation.  Initial Plan:  ***CT head to evaluate for intracranial hemorrhage or intracranial mass as etiology. Due to duration of symptoms, would expect reasonable sensitivity of this test for CVA.  ***Screening labs including CBC and Metabolic panel to evaluate for infectious or metabolic etiology of disease.  ***Urinalysis with reflex culture ordered to evaluate for UTI or relevant urologic/nephrologic pathology.  ***CXR to evaluate for structural/infectious intrathoracic pathology.  ***EKG to evaluate for cardiac pathology Objective evaluation as below reviewed with plan for reassessment after administration of Meclizine .  Initial Study Results:   Laboratory  All laboratory results reviewed without evidence of clinically relevant pathology.   ***Exceptions include: ***   ***EKG EKG  was reviewed independently. Rate, rhythm, axis, intervals all examined and without medically relevant abnormality. ST segments without concerns for elevations.    Radiology:  All images reviewed independently. ***Agree with radiology report at this time.   No results found.    Consults: Case discussed with ***.   Reassessment and Plan:   ***    {Document critical care time when appropriate  Document review of labs and clinical decision tools ie CHADS2VASC2, etc  Document your independent review of radiology images and any outside records  Document your discussion with family members, caretakers and with consultants  Document social determinants of health affecting pt's care  Document your decision making why or why not admission, treatments were needed:32947:::1}   Final diagnoses:  None    ED Discharge Orders      None

## 2024-01-05 NOTE — Discharge Instructions (Signed)
 Your test here looked okay.  Please return for worsening dizziness inability to walk.  Please follow-up with your family doctor in the office.  Eat and drink as well as you can for the next few days.

## 2024-01-05 NOTE — ED Provider Notes (Signed)
 Received patient in turnover from Dr. Jerrol.  Please see their note for further details of Hx, PE.  Briefly patient is a 68 y.o. male with a Dizziness .  Started a couple days ago.  Plan to obtain MRI to assess for stroke.  MRI is negative.  Patient feeling better and able to walk now.  Will discharge home.  PCP follow-up.    Devin Share, DO 01/05/24 2010

## 2024-01-05 NOTE — ED Triage Notes (Signed)
 Patient states dizziness since yesterday morning around 3:30 am 01/04/24. States unable to stand independently since. N/V yesterday.

## 2024-01-14 DIAGNOSIS — H52223 Regular astigmatism, bilateral: Secondary | ICD-10-CM | POA: Diagnosis not present
